# Patient Record
Sex: Male | Born: 1990 | Race: White | Hispanic: No | Marital: Single
Health system: Southern US, Community
[De-identification: ages and names within clinical notes are randomized; demographics above are authoritative.]

## PROBLEM LIST (undated history)

## (undated) DIAGNOSIS — N200 Calculus of kidney: Secondary | ICD-10-CM

## (undated) HISTORY — PX: WRIST FRACTURE SURGERY: SHX121

## (undated) HISTORY — PX: GASTROSTOMY: SHX151

## (undated) HISTORY — PX: HERNIA REPAIR: SHX51

---

## 2012-10-19 ENCOUNTER — Emergency Department (INDEPENDENT_AMBULATORY_CARE_PROVIDER_SITE_OTHER)
Admission: EM | Admit: 2012-10-19 | Discharge: 2012-10-19 | Disposition: A | Discharge status: Home / Self Care | Payer: BC Managed Care – PPO | Source: Home / Self Care | Attending: Family Medicine | Admitting: Family Medicine

## 2012-10-19 ENCOUNTER — Emergency Department (INDEPENDENT_AMBULATORY_CARE_PROVIDER_SITE_OTHER): Payer: BC Managed Care – PPO

## 2012-10-19 ENCOUNTER — Encounter: Payer: Self-pay | Admitting: Emergency Medicine

## 2012-10-19 DIAGNOSIS — IMO0002 Reserved for concepts with insufficient information to code with codable children: Secondary | ICD-10-CM

## 2012-10-19 DIAGNOSIS — S336XXA Sprain of sacroiliac joint, initial encounter: Secondary | ICD-10-CM

## 2012-10-19 DIAGNOSIS — M545 Low back pain, unspecified: Secondary | ICD-10-CM

## 2012-10-19 DIAGNOSIS — X500XXA Overexertion from strenuous movement or load, initial encounter: Secondary | ICD-10-CM

## 2012-10-19 DIAGNOSIS — S335XXA Sprain of ligaments of lumbar spine, initial encounter: Secondary | ICD-10-CM

## 2012-10-19 MED ORDER — MELOXICAM 15 MG PO TABS: 15.0000 mg | ORAL_TABLET | Freq: Every day | ORAL | Status: DC

## 2012-10-19 MED ORDER — PREDNISONE 20 MG PO TABS: 20.0000 mg | ORAL_TABLET | Freq: Two times a day (BID) | ORAL | Status: DC

## 2012-10-19 MED ORDER — CYCLOBENZAPRINE HCL 10 MG PO TABS: ORAL_TABLET | ORAL | Status: DC

## 2012-10-19 NOTE — ED Notes (Signed)
Low back injury, x 1 week, lifted heavy bucket has had pain ever since, yesterday pouring grout twisted pain is worse. Doesn't want to claim Worker's Comp.

## 2012-10-19 NOTE — ED Provider Notes (Signed)
History     CSN: [id number]  Arrival date & time 10/19/12  9562   First MD Initiated Contact with Patient 10/19/12 1051      Chief Complaint  Patient presents with  . Back Injury      HPI Comments: Patient states that about a week ago while lifting a heavy bucket, he felt a popping sensation in his lower back.  The pain gradually improved until yesterday when he was mixing grout and he felt recurrent pain in his right lower back.  The pain has persisted and is worse with movement.  The pain occasionally radiates to both thighs.  No bowel or bladder dysfunction.  No saddle numbness  Patient is a 22 y.o. male presenting with back pain. The history is provided by the patient.  Back Pain  This is a recurrent problem. The current episode started yesterday. The problem occurs constantly. The problem has not changed since onset.The pain is associated with lifting heavy objects. The pain is present in the lumbar spine. The quality of the pain is described as aching and stabbing. The pain radiates to the right thigh and left thigh. The pain is moderate. The symptoms are aggravated by certain positions. The pain is the same all the time. Pertinent negatives include no fever, no numbness, no weight loss, no abdominal pain, no bowel incontinence, no perianal numbness, no bladder incontinence, no dysuria, no leg pain, no paresthesias, no paresis, no tingling and no weakness. He has tried NSAIDs for the symptoms. The treatment provided no relief.    History reviewed. No pertinent past medical history.  Past Surgical History  Procedure Date  . Hernia repair     No family history on file.  History  Substance Use Topics  . Smoking status: Current Every Day Smoker -- 1.0 packs/day for 2 years    Types: Cigarettes  . Smokeless tobacco: Current User    Types: Chew  . Alcohol Use: No      Review of Systems  Constitutional: Negative for fever and weight loss.  Gastrointestinal: Negative for  abdominal pain and bowel incontinence.  Genitourinary: Negative for bladder incontinence and dysuria.  Musculoskeletal: Positive for back pain.  Neurological: Negative for tingling, weakness, numbness and paresthesias.  All other systems reviewed and are negative.    Allergies  Review of patient's allergies indicates no known allergies.  Home Medications   Current Outpatient Rx  Name  Route  Sig  Dispense  Refill  . NAPROXEN SODIUM 220 MG PO TABS   Oral   Take 220 mg by mouth 2 (two) times daily with a meal.         . CYCLOBENZAPRINE HCL 10 MG PO TABS      Take one tab by mouth at bedtime for muscle cramps   15 tablet   0   . MELOXICAM 15 MG PO TABS   Oral   Take 1 tablet (15 mg total) by mouth daily. Take with food each morning   10 tablet   0   . PREDNISONE 20 MG PO TABS   Oral   Take 1 tablet (20 mg total) by mouth 2 (two) times daily.   10 tablet   0     BP 118/76  Pulse 65  Temp 97.5 F (36.4 C) (Oral)  Resp 16  Ht 6\' 2"  (1.88 m)  Wt 233 lb (105.688 kg)  BMI 29.92 kg/m2  SpO2 99%  Physical Exam Nursing notes and Vital Signs reviewed. Appearance:  Patient appears healthy, stated age, and in no acute distress Eyes:  Pupils are equal, round, and reactive to light and accomodation.  Extraocular movement is intact.  Conjunctivae are not inflamed  Pharynx:  Normal Neck:  Supple.   No adenopathy Lungs:  Clear to auscultation.  Breath sounds are equal.  Heart:  Regular rate and rhythm without murmurs, rubs, or gallops.  Abdomen:  Nontender without masses or hepatosplenomegaly.  Bowel sounds are present.  No CVA or flank tenderness.  Extremities:  No edema.  No calf tenderness Skin:  No rash present. Back:   Can heel/toe walk and squat without difficulty.  Decreased forward flexion.  Tenderness in the midline and bilateral paraspinous muscles at L4-L5.  Straight leg raising test is negative.  Sitting knee extension test is negative.  Strength and sensation in  the lower extremities is normal.  Patellar and achilles reflexes are normal    ED Course  Procedures none    Dg Lumbar Spine Complete  10/19/2012  *RADIOLOGY REPORT*  Clinical Data: Low back pain.  Injury 1 week ago.  LUMBAR SPINE - COMPLETE 4+ VIEW  Comparison: None.  Findings: Five lumbar-type vertebral bodies show normal alignment. Disc space heights are normal.  No evidence of facet arthropathy, pars defect or slippage.  Sacroiliac joints appear normal.  IMPRESSION: Negative radiographs   Original Report Authenticated By: Paulina Fusi, M.D.      1. Low back sprain       MDM  Begin prednisone burst.  After that, begin Mobic.  Flexeril at bedtime. Apply ice pack to lower back twice daily for about 30 minutes.   Begin Mobic (meloxicam) AFTER finishing prednisone. Begin back exercises in about 5 to 7 days. May use back brace at work for about 2 weeks, then discontinue. Followup with [facility] if not improving about three weeks.        Lattie Haw, MD 10/23/12 [phone]

## 2015-10-17 ENCOUNTER — Emergency Department (HOSPITAL_COMMUNITY): Payer: BLUE CROSS/BLUE SHIELD

## 2015-10-17 ENCOUNTER — Observation Stay (HOSPITAL_COMMUNITY)
Admission: EM | Admit: 2015-10-17 | Discharge: 2015-10-18 | Disposition: A | Discharge status: Home / Self Care | Payer: BLUE CROSS/BLUE SHIELD | Attending: General Surgery | Admitting: General Surgery

## 2015-10-17 ENCOUNTER — Encounter (HOSPITAL_COMMUNITY): Payer: Self-pay | Admitting: Emergency Medicine

## 2015-10-17 DIAGNOSIS — R1031 Right lower quadrant pain: Secondary | ICD-10-CM | POA: Diagnosis present

## 2015-10-17 DIAGNOSIS — K3580 Unspecified acute appendicitis: Secondary | ICD-10-CM | POA: Diagnosis not present

## 2015-10-17 DIAGNOSIS — R112 Nausea with vomiting, unspecified: Secondary | ICD-10-CM | POA: Insufficient documentation

## 2015-10-17 DIAGNOSIS — F17210 Nicotine dependence, cigarettes, uncomplicated: Secondary | ICD-10-CM | POA: Diagnosis not present

## 2015-10-17 DIAGNOSIS — F17220 Nicotine dependence, chewing tobacco, uncomplicated: Secondary | ICD-10-CM | POA: Diagnosis not present

## 2015-10-17 LAB — COMPREHENSIVE METABOLIC PANEL
ALT: 23 U/L (ref 17–63)
AST: 24 U/L (ref 15–41)
Albumin: 5.3 g/dL — ABNORMAL HIGH (ref 3.5–5.0)
Alkaline Phosphatase: 58 U/L (ref 38–126)
Anion gap: 12 (ref 5–15)
BUN: 11 mg/dL (ref 6–20)
CO2: 25 mmol/L (ref 22–32)
Calcium: 9.8 mg/dL (ref 8.9–10.3)
Chloride: 100 mmol/L — ABNORMAL LOW (ref 101–111)
Creatinine, Ser: 0.94 mg/dL (ref 0.61–1.24)
GFR calc Af Amer: >60 mL/min (ref >60)
GFR calc non Af Amer: >60 mL/min (ref >60)
Glucose, Bld: 126 mg/dL — ABNORMAL HIGH (ref 65–99)
Potassium: 3.9 mmol/L (ref 3.5–5.1)
Sodium: 137 mmol/L (ref 135–145)
Total Bilirubin: 2 mg/dL — ABNORMAL HIGH (ref 0.3–1.2)
Total Protein: 8.2 g/dL — ABNORMAL HIGH (ref 6.5–8.1)

## 2015-10-17 LAB — CBC
HCT: 46.1 % (ref 39.0–52.0)
Hemoglobin: 16.9 g/dL (ref 13.0–17.0)
MCH: 30.5 pg (ref 26.0–34.0)
MCHC: 36.7 g/dL — ABNORMAL HIGH (ref 30.0–36.0)
MCV: 83.2 fL (ref 78.0–100.0)
Platelets: 237 10*3/uL (ref 150–400)
RBC: 5.54 MIL/uL (ref 4.22–5.81)
RDW: 11.9 % (ref 11.5–15.5)
WBC: 15.8 10*3/uL — ABNORMAL HIGH (ref 4.0–10.5)

## 2015-10-17 LAB — LIPASE, BLOOD: Lipase: 21 U/L (ref 11–51)

## 2015-10-17 MED ORDER — ONDANSETRON HCL 4 MG/2ML IJ SOLN
4.0000 mg | Freq: Once | INTRAMUSCULAR | Status: AC
  Administered 2015-10-17: 4 mg via INTRAVENOUS
  Filled 2015-10-17: qty 2

## 2015-10-17 MED ORDER — IOHEXOL 300 MG/ML  SOLN
100.0000 mL | Freq: Once | INTRAMUSCULAR | Status: AC | PRN
  Administered 2015-10-17: 100 mL via INTRAVENOUS

## 2015-10-17 MED ORDER — HYDROMORPHONE HCL 1 MG/ML IJ SOLN
1.0000 mg | Freq: Once | INTRAMUSCULAR | Status: AC
  Administered 2015-10-17: 1 mg via INTRAVENOUS
  Filled 2015-10-17: qty 1

## 2015-10-17 MED ORDER — HYDROMORPHONE HCL 1 MG/ML IJ SOLN
1.0000 mg | Freq: Once | INTRAMUSCULAR | Status: AC
Start: 2015-10-17 — End: 2015-10-17
  Administered 2015-10-17: 1 mg via INTRAVENOUS
  Filled 2015-10-17: qty 1

## 2015-10-17 MED ORDER — PIPERACILLIN-TAZOBACTAM 3.375 G IVPB 30 MIN
3.3750 g | Freq: Once | INTRAVENOUS | Status: AC
  Administered 2015-10-17: 3.375 g via INTRAVENOUS
  Filled 2015-10-17: qty 50

## 2015-10-17 NOTE — ED Notes (Signed)
Patient c/o RLQ pain- sudden onset with nausea and vomiting X1.

## 2015-10-17 NOTE — ED Provider Notes (Signed)
Patient seen/examined in the Emergency Department in conjunction with Midlevel Provider [facility] Patient reports RLQ abd pain Exam : awake/alert,, clutching his right lower abdomen Plan: pt with acute appendicitis.  Surgery has been consulted.   Zadie Rhine, MD 10/17/15 [phone]

## 2015-10-17 NOTE — ED Provider Notes (Signed)
CSN: [id number]     Arrival date & time 10/17/15  2121 History   First MD Initiated Contact with Patient 10/17/15 2144     Chief Complaint  Patient presents with  . Abdominal Pain     (Consider location/radiation/quality/duration/timing/severity/associated sxs/prior Treatment) Patient is a 25 y.o. male presenting with abdominal pain. The history is provided by the patient.  Abdominal Pain Pain location:  RLQ Pain quality: squeezing   Pain radiates to:  Does not radiate Pain severity:  Severe Onset quality:  Gradual Duration:  1 day Timing:  Constant Progression:  Worsening Chronicity:  New Relieved by:  Nothing Worsened by:  Nothing tried Ineffective treatments:  Acetaminophen, NSAIDs, lying down and antacids Associated symptoms: anorexia, chills and nausea   Associated symptoms: no dysuria    Olegario Emberson is a 25 y.o. male who presents to the ED with abdominal pain that started last night. He reports that the pain started as generalized abdominal pain and he took Pepcid and Gas X he also took OTC pain medication without relief. He went to work and the pain increased as the night went on. Today the pain has increased and has localized in the RLQ with nausea. He reports that the pain is so bad now it hurts to move or walk. On the way here every bump caused pain.   History reviewed. No pertinent past medical history. Past Surgical History  Procedure Laterality Date  . Hernia repair     No family history on file. Social History  Substance Use Topics  . Smoking status: Current Every Day Smoker -- 1.00 packs/day for 2 years    Types: Cigarettes  . Smokeless tobacco: Current User    Types: Chew  . Alcohol Use: No    Review of Systems  Constitutional: Positive for chills.  Gastrointestinal: Positive for nausea, abdominal pain and anorexia.  Genitourinary: Negative for dysuria, frequency, discharge, scrotal swelling, penile pain and testicular pain.  all other systems  negative    Allergies  Review of patient's allergies indicates no known allergies.  Home Medications   Prior to Admission medications   Medication Sig Start Date End Date Taking? Authorizing Provider  Alpha-D-Galactosidase (BEANO PO) Take 1 tablet by mouth daily as needed (for acid relief).   Yes Historical Provider, MD  famotidine (PEPCID) 20 MG tablet Take 20 mg by mouth daily as needed for heartburn or indigestion.   Yes Historical Provider, MD  naproxen sodium (ANAPROX) 220 MG tablet Take 220 mg by mouth 2 (two) times daily with a meal.   Yes Historical Provider, MD  simethicone (GAS-X) 80 MG chewable tablet Chew 80 mg by mouth every 6 (six) hours as needed for flatulence.   Yes Historical Provider, MD   BP 142/83 mmHg  Pulse 110  Temp(Src) 97.9 F (36.6 C) (Oral)  Resp 18  Ht  (1.88 m)  Wt 97.523 kg  BMI 27.59 kg/m2  SpO2 100% Physical Exam  Constitutional: He is oriented to person, place, and time. He appears well-developed and well-nourished. No distress.  HENT:  Head: Normocephalic and atraumatic.  Eyes: Conjunctivae and EOM are normal.  Neck: Normal range of motion. Neck supple.  Cardiovascular: Normal rate and regular rhythm.   Pulmonary/Chest: Effort normal and breath sounds normal.  Abdominal: Soft. Bowel sounds are normal. There is tenderness in the right lower quadrant. There is rebound, guarding and tenderness at McBurney's point. There is no CVA tenderness.  Musculoskeletal: Normal range of motion.  Neurological: He is alert  and oriented to person, place, and time. No cranial nerve deficit.  Skin: Skin is warm and dry.  Psychiatric: He has a normal mood and affect. His behavior is normal.  Nursing note and vitals reviewed.   ED Course  Procedures (including critical care time) Labs Review Results for orders placed or performed during the hospital encounter of 10/17/15 (from the past 24 hour(s))  Lipase, blood     Status: None   Collection Time:  10/17/15  9:52 PM  Result Value Ref Range   Lipase 21 11 - 51 U/L  Comprehensive metabolic panel     Status: Abnormal   Collection Time: 10/17/15  9:52 PM  Result Value Ref Range   Sodium 137 135 - 145 mmol/L   Potassium 3.9 3.5 - 5.1 mmol/L   Chloride 100 (L) 101 - 111 mmol/L   CO2 25 22 - 32 mmol/L   Glucose, Bld 126 (H) 65 - 99 mg/dL   BUN 11 6 - 20 mg/dL   Creatinine, Ser 2.13 0.61 - 1.24 mg/dL   Calcium 9.8 8.9 - 08.6 mg/dL   Total Protein 8.2 (H) 6.5 - 8.1 g/dL   Albumin 5.3 (H) 3.5 - 5.0 g/dL   AST 24 15 - 41 U/L   ALT 23 17 - 63 U/L   Alkaline Phosphatase 58 38 - 126 U/L   Total Bilirubin 2.0 (H) 0.3 - 1.2 mg/dL   GFR calc non Af Amer >60 >60 mL/min   GFR calc Af Amer >60 >60 mL/min   Anion gap 12 5 - 15  CBC     Status: Abnormal   Collection Time: 10/17/15  9:52 PM  Result Value Ref Range   WBC 15.8 (H) 4.0 - 10.5 K/uL   RBC 5.54 4.22 - 5.81 MIL/uL   Hemoglobin 16.9 13.0 - 17.0 g/dL   HCT 57.8 46.9 - 62.9 %   MCV 83.2 78.0 - 100.0 fL   MCH 30.5 26.0 - 34.0 pg   MCHC 36.7 (H) 30.0 - 36.0 g/dL   RDW 52.8 41.3 - 24.4 %   Platelets 237 150 - 400 K/uL     Imaging Review Ct Abdomen Pelvis W Contrast  10/17/2015  CLINICAL DATA:  Right lower quadrant pain since yesterday. Initial encounter. EXAM: CT ABDOMEN AND PELVIS WITH CONTRAST TECHNIQUE: Multidetector CT imaging of the abdomen and pelvis was performed using the standard protocol following bolus administration of intravenous contrast. CONTRAST:  100 mL OMNIPAQUE IOHEXOL 300 MG/ML  SOLN COMPARISON:  None. FINDINGS: Lung bases are clear.  No pleural or pericardial effusion. The appendix is dilated with periappendiceal stranding consistent with acute appendicitis. No abscess or perforation is identified. The stomach and small and large bowel appear normal. The gallbladder, liver, spleen, adrenal glands, kidneys and pancreas appear normal. There is no lymphadenopathy or fluid. No focal bony abnormality is seen. IMPRESSION:  Acute appendicitis without abscess or perforation. These results were called by telephone at the time of interpretation on 10/17/2015 at 11:17 pm to HOPE NEESE, N.P. , who verbally acknowledged these results. Electronically Signed   By: Drusilla Kanner M.D.   On: 10/17/2015 23:19   I have personally reviewed and evaluated thelab results as part of my medical decision-making.   MDM  25 y.o. male with abdominal pain that started 24 hours prior to arrival to the ED and has gotten progressively worse. Will admit to Dr. Lovell Sheehan and start antibiotics. Dr. Lovell Sheehan will plan to do surgery. I discussed lab, CT results with  the patient and plans for admission and surgery. He voices understanding and agrees with plan.   Final diagnoses:  Acute appendicitis, unspecified acute appendicitis type      [facility], NP 10/17/15 1610  Zadie Rhine, MD 10/18/15 [phone]

## 2015-10-17 NOTE — ED Notes (Signed)
Pt c/o rt lower abd pain since last night.

## 2015-10-18 ENCOUNTER — Observation Stay (HOSPITAL_COMMUNITY): Payer: BLUE CROSS/BLUE SHIELD | Admitting: Anesthesiology

## 2015-10-18 ENCOUNTER — Encounter (HOSPITAL_COMMUNITY): Payer: Self-pay | Admitting: *Deleted

## 2015-10-18 ENCOUNTER — Encounter (HOSPITAL_COMMUNITY)
Admission: EM | Disposition: A | Discharge status: Home / Self Care | Payer: Self-pay | Source: Home / Self Care | Attending: Emergency Medicine

## 2015-10-18 DIAGNOSIS — K3580 Unspecified acute appendicitis: Secondary | ICD-10-CM | POA: Diagnosis not present

## 2015-10-18 HISTORY — PX: LAPAROSCOPIC APPENDECTOMY: SHX408

## 2015-10-18 LAB — SURGICAL PCR SCREEN
MRSA, PCR: NEGATIVE
Staphylococcus aureus: NEGATIVE

## 2015-10-18 LAB — URINALYSIS, ROUTINE W REFLEX MICROSCOPIC
Bilirubin Urine: NEGATIVE
Glucose, UA: NEGATIVE mg/dL
Hgb urine dipstick: NEGATIVE
Ketones, ur: NEGATIVE mg/dL
Leukocytes, UA: NEGATIVE
Nitrite: NEGATIVE
Protein, ur: NEGATIVE mg/dL
Specific Gravity, Urine: <1.005 — ABNORMAL LOW (ref 1.005–1.030)
pH: 6.5 (ref 5.0–8.0)

## 2015-10-18 SURGERY — APPENDECTOMY, LAPAROSCOPIC
Anesthesia: General

## 2015-10-18 MED ORDER — ENOXAPARIN SODIUM 40 MG/0.4ML ~~LOC~~ SOLN
40.0000 mg | SUBCUTANEOUS | Status: DC
  Administered 2015-10-18: 40 mg via SUBCUTANEOUS
  Filled 2015-10-18: qty 0.4

## 2015-10-18 MED ORDER — ACETAMINOPHEN 650 MG RE SUPP: 650.0000 mg | Freq: Four times a day (QID) | RECTAL | Status: DC | PRN

## 2015-10-18 MED ORDER — LACTATED RINGERS IV SOLN
INTRAVENOUS | Status: DC
  Administered 2015-10-18 (×2): via INTRAVENOUS

## 2015-10-18 MED ORDER — DIPHENHYDRAMINE HCL 50 MG/ML IJ SOLN: 25.0000 mg | Freq: Four times a day (QID) | INTRAMUSCULAR | Status: DC | PRN

## 2015-10-18 MED ORDER — ENOXAPARIN SODIUM 40 MG/0.4ML ~~LOC~~ SOLN: 40.0000 mg | SUBCUTANEOUS | Status: DC

## 2015-10-18 MED ORDER — HYDROMORPHONE HCL 1 MG/ML IJ SOLN
1.0000 mg | INTRAMUSCULAR | Status: DC | PRN
  Administered 2015-10-18: 1 mg via INTRAVENOUS
  Filled 2015-10-18: qty 1

## 2015-10-18 MED ORDER — NEOSTIGMINE METHYLSULFATE 10 MG/10ML IV SOLN
INTRAVENOUS | Status: DC | PRN
  Administered 2015-10-18: 3 mg via INTRAVENOUS

## 2015-10-18 MED ORDER — GLYCOPYRROLATE 0.2 MG/ML IJ SOLN
INTRAMUSCULAR | Status: DC | PRN
  Administered 2015-10-18: 0.6 mg via INTRAVENOUS

## 2015-10-18 MED ORDER — NEOSTIGMINE METHYLSULFATE 10 MG/10ML IV SOLN
INTRAVENOUS | Status: AC
  Filled 2015-10-18: qty 1

## 2015-10-18 MED ORDER — LACTATED RINGERS IV SOLN: INTRAVENOUS | Status: DC

## 2015-10-18 MED ORDER — ONDANSETRON HCL 4 MG/2ML IJ SOLN
4.0000 mg | Freq: Four times a day (QID) | INTRAMUSCULAR | Status: DC | PRN
Start: 2015-10-18 — End: 2015-10-18

## 2015-10-18 MED ORDER — LORAZEPAM 2 MG/ML IJ SOLN: 1.0000 mg | INTRAMUSCULAR | Status: DC | PRN

## 2015-10-18 MED ORDER — OXYCODONE-ACETAMINOPHEN 7.5-325 MG PO TABS: 1.0000 | ORAL_TABLET | ORAL | Status: DC | PRN

## 2015-10-18 MED ORDER — FENTANYL CITRATE (PF) 100 MCG/2ML IJ SOLN
INTRAMUSCULAR | Status: DC | PRN
  Administered 2015-10-18 (×2): 50 ug via INTRAVENOUS
  Administered 2015-10-18: 100 ug via INTRAVENOUS
  Administered 2015-10-18: 50 ug via INTRAVENOUS

## 2015-10-18 MED ORDER — FENTANYL CITRATE (PF) 100 MCG/2ML IJ SOLN
25.0000 ug | INTRAMUSCULAR | Status: AC
  Administered 2015-10-18 (×2): 25 ug via INTRAVENOUS

## 2015-10-18 MED ORDER — DIPHENHYDRAMINE HCL 25 MG PO CAPS: 25.0000 mg | ORAL_CAPSULE | Freq: Four times a day (QID) | ORAL | Status: DC | PRN

## 2015-10-18 MED ORDER — OXYCODONE-ACETAMINOPHEN 5-325 MG PO TABS: 1.0000 | ORAL_TABLET | ORAL | Status: DC | PRN

## 2015-10-18 MED ORDER — PIPERACILLIN-TAZOBACTAM 3.375 G IVPB
3.3750 g | Freq: Three times a day (TID) | INTRAVENOUS | Status: DC
  Administered 2015-10-18: 3.375 g via INTRAVENOUS

## 2015-10-18 MED ORDER — PROPOFOL 10 MG/ML IV BOLUS
INTRAVENOUS | Status: AC
  Filled 2015-10-18: qty 40

## 2015-10-18 MED ORDER — FENTANYL CITRATE (PF) 250 MCG/5ML IJ SOLN
INTRAMUSCULAR | Status: AC
  Filled 2015-10-18: qty 5

## 2015-10-18 MED ORDER — HYDROMORPHONE HCL 1 MG/ML IJ SOLN
2.0000 mg | INTRAMUSCULAR | Status: DC | PRN
Start: 2015-10-18 — End: 2015-10-18
  Administered 2015-10-18 (×5): 2 mg via INTRAVENOUS
  Filled 2015-10-18 (×3): qty 2

## 2015-10-18 MED ORDER — BUPIVACAINE HCL (PF) 0.5 % IJ SOLN
INTRAMUSCULAR | Status: DC | PRN
  Administered 2015-10-18: 10 mL

## 2015-10-18 MED ORDER — SODIUM CHLORIDE 0.9 % IR SOLN
Status: DC | PRN
  Administered 2015-10-18: 1000 mL

## 2015-10-18 MED ORDER — KETOROLAC TROMETHAMINE 30 MG/ML IJ SOLN
30.0000 mg | Freq: Once | INTRAMUSCULAR | Status: AC
  Administered 2015-10-18: 30 mg via INTRAVENOUS

## 2015-10-18 MED ORDER — BUPIVACAINE HCL (PF) 0.5 % IJ SOLN
INTRAMUSCULAR | Status: AC
  Filled 2015-10-18: qty 30

## 2015-10-18 MED ORDER — ONDANSETRON HCL 4 MG/2ML IJ SOLN: 4.0000 mg | Freq: Once | INTRAMUSCULAR | Status: DC | PRN

## 2015-10-18 MED ORDER — ROCURONIUM BROMIDE 50 MG/5ML IV SOLN
INTRAVENOUS | Status: AC
  Filled 2015-10-18: qty 1

## 2015-10-18 MED ORDER — KETOROLAC TROMETHAMINE 30 MG/ML IJ SOLN
INTRAMUSCULAR | Status: AC
  Filled 2015-10-18: qty 1

## 2015-10-18 MED ORDER — ONDANSETRON HCL 4 MG/2ML IJ SOLN
4.0000 mg | Freq: Four times a day (QID) | INTRAMUSCULAR | Status: DC | PRN
  Administered 2015-10-18: 4 mg via INTRAVENOUS
  Filled 2015-10-18: qty 2

## 2015-10-18 MED ORDER — FENTANYL CITRATE (PF) 100 MCG/2ML IJ SOLN
INTRAMUSCULAR | Status: AC
  Filled 2015-10-18: qty 2

## 2015-10-18 MED ORDER — FENTANYL CITRATE (PF) 100 MCG/2ML IJ SOLN
25.0000 ug | INTRAMUSCULAR | Status: DC | PRN
  Administered 2015-10-18: 50 ug via INTRAVENOUS
  Filled 2015-10-18: qty 2

## 2015-10-18 MED ORDER — SIMETHICONE 80 MG PO CHEW: 40.0000 mg | CHEWABLE_TABLET | Freq: Four times a day (QID) | ORAL | Status: DC | PRN

## 2015-10-18 MED ORDER — SUCCINYLCHOLINE CHLORIDE 20 MG/ML IJ SOLN
INTRAMUSCULAR | Status: AC
  Filled 2015-10-18: qty 1

## 2015-10-18 MED ORDER — PROPOFOL 10 MG/ML IV BOLUS
INTRAVENOUS | Status: DC | PRN
  Administered 2015-10-18: 20 mg via INTRAVENOUS
  Administered 2015-10-18: 30 mg via INTRAVENOUS
  Administered 2015-10-18: 180 mg via INTRAVENOUS

## 2015-10-18 MED ORDER — SUCCINYLCHOLINE CHLORIDE 20 MG/ML IJ SOLN
INTRAMUSCULAR | Status: DC | PRN
  Administered 2015-10-18: 120 mg via INTRAVENOUS

## 2015-10-18 MED ORDER — POVIDONE-IODINE 10 % OINT PACKET
TOPICAL_OINTMENT | CUTANEOUS | Status: DC | PRN
  Administered 2015-10-18: 1 via TOPICAL

## 2015-10-18 MED ORDER — MIDAZOLAM HCL 2 MG/2ML IJ SOLN
1.0000 mg | INTRAMUSCULAR | Status: DC | PRN
  Administered 2015-10-18: 2 mg via INTRAVENOUS
  Filled 2015-10-18: qty 2

## 2015-10-18 MED ORDER — ACETAMINOPHEN 325 MG PO TABS: 650.0000 mg | ORAL_TABLET | Freq: Four times a day (QID) | ORAL | Status: DC | PRN

## 2015-10-18 MED ORDER — FENTANYL CITRATE (PF) 100 MCG/2ML IJ SOLN
25.0000 ug | Freq: Once | INTRAMUSCULAR | Status: AC
  Administered 2015-10-18: 25 ug via INTRAVENOUS

## 2015-10-18 MED ORDER — LIDOCAINE HCL (PF) 1 % IJ SOLN
INTRAMUSCULAR | Status: AC
  Filled 2015-10-18: qty 5

## 2015-10-18 MED ORDER — ONDANSETRON 4 MG PO TBDP: 4.0000 mg | ORAL_TABLET | Freq: Four times a day (QID) | ORAL | Status: DC | PRN

## 2015-10-18 MED ORDER — POVIDONE-IODINE 10 % EX OINT
TOPICAL_OINTMENT | CUTANEOUS | Status: AC
  Filled 2015-10-18: qty 1

## 2015-10-18 MED ORDER — LIDOCAINE HCL (CARDIAC) 10 MG/ML IV SOLN
INTRAVENOUS | Status: DC | PRN
  Administered 2015-10-18: 50 mg via INTRAVENOUS

## 2015-10-18 MED ORDER — HYDROMORPHONE HCL 1 MG/ML IJ SOLN
2.0000 mg | INTRAMUSCULAR | Status: DC | PRN
Start: 2015-10-18 — End: 2015-10-18
  Filled 2015-10-18 (×2): qty 2

## 2015-10-18 MED ORDER — ONDANSETRON HCL 4 MG/2ML IJ SOLN
4.0000 mg | Freq: Once | INTRAMUSCULAR | Status: AC
  Administered 2015-10-18: 4 mg via INTRAVENOUS
  Filled 2015-10-18: qty 2

## 2015-10-18 MED ORDER — GLYCOPYRROLATE 0.2 MG/ML IJ SOLN
INTRAMUSCULAR | Status: AC
  Filled 2015-10-18: qty 3

## 2015-10-18 MED ORDER — ROCURONIUM BROMIDE 100 MG/10ML IV SOLN
INTRAVENOUS | Status: DC | PRN
  Administered 2015-10-18: 30 mg via INTRAVENOUS
  Administered 2015-10-18: 5 mg via INTRAVENOUS

## 2015-10-18 SURGICAL SUPPLY — 39 items
BAG HAMPER (MISCELLANEOUS) ×2 IMPLANT
CHLORAPREP W/TINT 26ML (MISCELLANEOUS) ×2 IMPLANT
CLOTH BEACON ORANGE TIMEOUT ST (SAFETY) ×2 IMPLANT
COVER LIGHT HANDLE STERIS (MISCELLANEOUS) ×4 IMPLANT
CUTTER FLEX LINEAR 45M (STAPLE) ×2 IMPLANT
DECANTER SPIKE VIAL GLASS SM (MISCELLANEOUS) ×2 IMPLANT
ELECT REM PT RETURN 9FT ADLT (ELECTROSURGICAL) ×2
ELECTRODE REM PT RTRN 9FT ADLT (ELECTROSURGICAL) ×1 IMPLANT
EVACUATOR SMOKE 8.L (FILTER) ×2 IMPLANT
FORMALIN 10 PREFIL 120ML (MISCELLANEOUS) ×2 IMPLANT
GLOVE BIOGEL PI IND STRL 7.0 (GLOVE) ×1 IMPLANT
GLOVE BIOGEL PI INDICATOR 7.0 (GLOVE) ×1
GLOVE SURG SS PI 7.5 STRL IVOR (GLOVE) ×4 IMPLANT
GOWN STRL REUS W/ TWL XL LVL3 (GOWN DISPOSABLE) ×1 IMPLANT
GOWN STRL REUS W/TWL LRG LVL3 (GOWN DISPOSABLE) ×2 IMPLANT
GOWN STRL REUS W/TWL XL LVL3 (GOWN DISPOSABLE) ×1
INST SET LAPROSCOPIC AP (KITS) ×2 IMPLANT
KIT ROOM TURNOVER APOR (KITS) ×2 IMPLANT
MANIFOLD NEPTUNE II (INSTRUMENTS) ×2 IMPLANT
NEEDLE INSUFFLATION 14GA 120MM (NEEDLE) ×2 IMPLANT
NS IRRIG 1000ML POUR BTL (IV SOLUTION) ×2 IMPLANT
PACK LAP CHOLE LZT030E (CUSTOM PROCEDURE TRAY) ×2 IMPLANT
PAD ARMBOARD 7.5X6 YLW CONV (MISCELLANEOUS) ×2 IMPLANT
PENCIL HANDSWITCHING (ELECTRODE) ×2 IMPLANT
POUCH SPECIMEN RETRIEVAL 10MM (ENDOMECHANICALS) ×2 IMPLANT
RELOAD 45 VASCULAR/THIN (ENDOMECHANICALS) ×2 IMPLANT
SET BASIN LINEN APH (SET/KITS/TRAYS/PACK) ×2 IMPLANT
SHEARS HARMONIC ACE PLUS 36CM (ENDOMECHANICALS) ×2 IMPLANT
SPONGE GAUZE 2X2 8PLY STRL LF (GAUZE/BANDAGES/DRESSINGS) ×6 IMPLANT
STAPLER VISISTAT (STAPLE) ×2 IMPLANT
SUT VICRYL 0 UR6 27IN ABS (SUTURE) ×2 IMPLANT
TAPE CLOTH SURG 4X10 WHT LF (GAUZE/BANDAGES/DRESSINGS) ×2 IMPLANT
TRAY FOLEY CATH SILVER 16FR (SET/KITS/TRAYS/PACK) ×2 IMPLANT
TROCAR ENDO BLADELESS 11MM (ENDOMECHANICALS) ×2 IMPLANT
TROCAR ENDO BLADELESS 12MM (ENDOMECHANICALS) ×2 IMPLANT
TROCAR XCEL NON-BLD 5MMX100MML (ENDOMECHANICALS) ×2 IMPLANT
TUBING INSUFFLATION (TUBING) ×2 IMPLANT
WARMER LAPAROSCOPE (MISCELLANEOUS) ×2 IMPLANT
YANKAUER SUCT 12FT TUBE ARGYLE (SUCTIONS) ×2 IMPLANT

## 2015-10-18 NOTE — Anesthesia Preprocedure Evaluation (Signed)
Anesthesia Evaluation  Patient identified by MRN, date of birth, ID band Patient awake    Reviewed: Allergy & Precautions, NPO status , Patient's Chart, lab work & pertinent test results  Airway Mallampati: I  TM Distance: >3 FB     Dental  (+) Teeth Intact, Dental Advisory Given   Pulmonary Current Smoker,    breath sounds clear to auscultation       Cardiovascular negative cardio ROS   Rhythm:Regular Rate:Normal     Neuro/Psych    GI/Hepatic GERD  ,RLQ pain    Endo/Other    Renal/GU      Musculoskeletal   Abdominal   Peds  Hematology   Anesthesia Other Findings   Reproductive/Obstetrics                             Anesthesia Physical Anesthesia Plan  ASA: II and emergent  Anesthesia Plan: General   Post-op Pain Management:    Induction: Intravenous, Rapid sequence and Cricoid pressure planned  Airway Management Planned: Oral ETT  Additional Equipment:   Intra-op Plan:   Post-operative Plan: Extubation in OR  Informed Consent: I have reviewed the patients History and Physical, chart, labs and discussed the procedure including the risks, benefits and alternatives for the proposed anesthesia with the patient or authorized representative who has indicated his/her understanding and acceptance.     Plan Discussed with:   Anesthesia Plan Comments:         Anesthesia Quick Evaluation

## 2015-10-18 NOTE — Anesthesia Postprocedure Evaluation (Signed)
Anesthesia Post Note  Patient: Jason Peters  Procedure(s) Performed: Procedure(s) (LRB): APPENDECTOMY LAPAROSCOPIC (N/A)  Patient location during evaluation: PACU Anesthesia Type: General Level of consciousness: awake, oriented and patient cooperative Pain management: satisfactory to patient Vital Signs Assessment: post-procedure vital signs reviewed and stable Respiratory status: spontaneous breathing and non-rebreather facemask Cardiovascular status: stable Anesthetic complications: no    Last Vitals:  Filed Vitals:   10/18/15 1035 10/18/15 1153  BP: 107/56   Pulse:    Temp:  38.8 C  Resp: 14     Last Pain:  Filed Vitals:   10/18/15 1202  PainSc: Asleep                 Taylan Mayhan

## 2015-10-18 NOTE — Discharge Summary (Signed)
Physician Discharge Summary  Patient ID: Jason Peters MRN: [id number] DOB/AGE: 15-Jan-1991 24 y.o.  Admit date: 10/17/2015 Discharge date: 10/18/2015  Admission Diagnoses: Acute appendicitis  Discharge Diagnoses: Same Active Problems:   Acute appendicitis   Discharged Condition: good  Hospital Course: Patient is a 25 year old white male who presented emergency room with a 24-hour history of worsening right lower quadrant abdominal pain. CT scan of the abdomen and pelvis revealed acute appendicitis. He was taken to the operating room on 10/18/2015 and underwent laparoscopic appendectomy. Tolerated the procedure well. His postoperative course has been unremarkable. His diet was advanced without difficulty. The patient is being discharged home on 10/18/2015 in good and improving condition.  Treatments: surgery: Laparoscopic appendectomy on 10/18/2015  Discharge Exam: Blood pressure 126/72, pulse 87, temperature 99.2 F (37.3 C), temperature source Oral, resp. rate 14, height  (1.88 m), weight 97.51 kg (214 lb 15.5 oz), SpO2 97 %. General appearance: alert, cooperative and no distress Resp: clear to auscultation bilaterally Cardio: regular rate and rhythm, S1, S2 normal, no murmur, click, rub or gallop GI: Soft, dressings dry and intact.  Disposition: 01-Home or Self Care     Medication List    TAKE these medications        BEANO PO  Take 1 tablet by mouth daily as needed (for acid relief).     famotidine 20 MG tablet  Commonly known as:  PEPCID  Take 20 mg by mouth daily as needed for heartburn or indigestion.     GAS-X 80 MG chewable tablet  Generic drug:  simethicone  Chew 80 mg by mouth every 6 (six) hours as needed for flatulence.     naproxen sodium 220 MG tablet  Commonly known as:  ANAPROX  Take 220 mg by mouth 2 (two) times daily with a meal.     oxyCODONE-acetaminophen 7.5-325 MG tablet  Commonly known as:  PERCOCET  Take 1-2 tablets by mouth every 4  (four) hours as needed.           Follow-up Information    Follow up with Dalia Heading, MD. Schedule an appointment as soon as possible for a visit on 10/24/2015.   Specialty:  General Surgery   Contact information:   [address] [place] [address] [phone]       Signed: Franky Macho A 10/18/2015, 3:44 PM

## 2015-10-18 NOTE — Transfer of Care (Signed)
Immediate Anesthesia Transfer of Care Note  Patient: Jason Peters  Procedure(s) Performed: Procedure(s): APPENDECTOMY LAPAROSCOPIC (N/A)  Patient Location: PACU  Anesthesia Type:General  Level of Consciousness: sedated and patient cooperative  Airway & Oxygen Therapy: Patient Spontanous Breathing and non-rebreather face mask  Post-op Assessment: Report given to RN, Post -op Vital signs reviewed and stable and Patient moving all extremities  Post vital signs: stable  Last Vitals:  Filed Vitals:   10/18/15 1030 10/18/15 1035  BP: 107/56 107/56  Pulse:    Temp:    Resp: 12 14    Complications: No apparent anesthesia complications

## 2015-10-18 NOTE — Discharge Instructions (Signed)
Laparoscopic Appendectomy, Adult, Care After °Refer to this sheet in the next few weeks. These instructions provide you with information on caring for yourself after your procedure. Your caregiver may also give you more specific instructions. Your treatment has been planned according to current medical practices, but problems sometimes occur. Call your caregiver if you have any problems or questions after your procedure. °HOME CARE INSTRUCTIONS °· Do not drive while taking narcotic pain medicines. °· Use stool softener if you become constipated from your pain medicines. °· Change your bandages (dressings) as directed. °· Keep your wounds clean and dry. You may wash the wounds gently with soap and water. Gently pat the wounds dry with a clean towel. °· Do not take baths, swim, or use hot tubs for 10 days, or as instructed by your caregiver. °· Only take over-the-counter or prescription medicines for pain, discomfort, or fever as directed by your caregiver. °· You may continue your normal diet as directed. °· Do not lift more than 10 pounds (4.5 kg) or play contact sports for 3 weeks, or as directed. °· Slowly increase your activity after surgery. °· Take deep breaths to avoid getting a lung infection (pneumonia). °SEEK MEDICAL CARE IF: °· You have redness, swelling, or increasing pain in your wounds. °· You have pus coming from your wounds. °· You have drainage from a wound that lasts longer than 1 day. °· You notice a bad smell coming from the wounds or dressing. °· Your wound edges break open after stitches (sutures) have been removed. °· You notice increasing pain in the shoulders (shoulder strap areas) or near your shoulder blades. °· You develop dizzy episodes or fainting while standing. °· You develop shortness of breath. °· You develop persistent nausea or vomiting. °· You cannot control your bowel functions or lose your appetite. °· You develop diarrhea. °SEEK IMMEDIATE MEDICAL CARE IF:  °· You have a  fever. °· You develop a rash. °· You have difficulty breathing or sharp pains in your chest. °· You develop any reaction or side effects to medicines given. °MAKE SURE YOU: °· Understand these instructions. °· Will watch your condition. °· Will get help right away if you are not doing well or get worse. °  °This information is not intended to replace advice given to you by your health care provider. Make sure you discuss any questions you have with your health care provider. °  °Document Released: 09/09/2005 Document Revised: 01/24/2015 Document Reviewed: 02/27/2015 °Elsevier Interactive Patient Education ©2016 [employer] ° °

## 2015-10-18 NOTE — Progress Notes (Signed)
Patient with orders to be discharge home. Discharge instructions given, patient verbalized understanding. Prescriptions given. Patient stable. Patient left in private vehicle with family.  

## 2015-10-18 NOTE — Op Note (Signed)
Patient:  Jason Peters  DOB:  September 07, 1991  MRN:  [id number]   Preop Diagnosis:  Acute appendicitis  Postop Diagnosis:  Same  Procedure:  Laparoscopic appendectomy  Surgeon:  Franky Macho, M.D.  Anes:  Gen. endotracheal  Indications:  Patient is a 25 year old white male who presents with a 24-hour history of worsening right lower quadrant abdominal pain. CT scan the abdomen reveals acute appendicitis. The risks and benefits of the procedure including bleeding, infection, and the possibility of an open procedure were fully explained to the patient, who gave informed consent.  Procedure note:  The patient was placed the supine position. After induction of general endotracheal anesthesia, the abdomen was prepped and draped using the usual sterile technique with DuraPrep. Surgical site confirmation was performed.  A supraumbilical incision was made down to the fascia. A Veress needle was introduced into the abdominal cavity and confirmation of placement was done using the saline drop test. The abdomen was then insufflated to 16 mmHg pressure. An 11 mm trocar was introduced into the abdominal cavity under direct visualization without difficulty. The patient was placed in deeper Trendelenburg position and an additional 12 mm port trocar was placed the suprapubic region and a 5 mm trocar was placed left lower quadrant region. The appendix was visualized and noted to be attached to the abdominal wall. It was elongated and diffusely inflamed except at its base. No evidence of perforation was noted. The mesoappendix was divided using the harmonic scalpel. A vascular Endo GIA was placed across the base the appendix and fired. The staple line was inspected and noted to be intact. The appendix was removed using an Endo Catch bag without difficulty. All fluid and air were then evacuated from the right lower quadrant of the abdomen prior to removal of the trochars.  All wounds were irrigated with normal  saline. All wounds were injected with 0.5% Sensorcaine. The supraumbilical fascia as well as suprapubic fascia were reapproximated using 0 Vicryl interrupted sutures. All skin incisions were closed using staples. Betadine ointment and dry sterile dressings were applied.  All tape and needle counts were correct at the end of the procedure. Patient was extubated in the operating room and transferred to PACU in stable condition.  Complications:  None  EBL:  Minimal  Specimen:  Appendix

## 2015-10-18 NOTE — Anesthesia Procedure Notes (Signed)
Procedure Name: Intubation Date/Time: 10/18/2015 10:57 AM Performed by: Franco Nones Pre-anesthesia Checklist: Patient identified, Patient being monitored, Timeout performed, Emergency Drugs available and Suction available Patient Re-evaluated:Patient Re-evaluated prior to inductionOxygen Delivery Method: Circle System Utilized Preoxygenation: Pre-oxygenation with 100% oxygen Intubation Type: IV induction Ventilation: Mask ventilation without difficulty Laryngoscope Size: Miller and 2 Grade View: Grade I Tube type: Oral Tube size: 7.0 mm Number of attempts: 1 Airway Equipment and Method: Stylet Placement Confirmation: ETT inserted through vocal cords under direct vision,  positive ETCO2 and breath sounds checked- equal and bilateral Secured at: 23 cm Tube secured with: Tape Dental Injury: Teeth and Oropharynx as per pre-operative assessment

## 2015-10-18 NOTE — H&P (Signed)
Jason Peters is an 25 y.o. male.   Chief Complaint: Right lower quadrant abdominal pain HPI: Patient is a 25 year old white male who presents with a 24-hour history of worsening right lower quadrant abdominal pain. CT scan of the abdomen revealed acute appendicitis.  History reviewed. No pertinent past medical history.  Past Surgical History  Procedure Laterality Date  . Hernia repair      History reviewed. No pertinent family history. Social History:  reports that he has been smoking Cigarettes.  He has a 2 pack-year smoking history. His smokeless tobacco use includes Chew. He reports that he does not drink alcohol. His drug history is not on file.  Allergies: No Known Allergies  Medications Prior to Admission  Medication Sig Dispense Refill  . Alpha-D-Galactosidase (BEANO PO) Take 1 tablet by mouth daily as needed (for acid relief).    . famotidine (PEPCID) 20 MG tablet Take 20 mg by mouth daily as needed for heartburn or indigestion.    . naproxen sodium (ANAPROX) 220 MG tablet Take 220 mg by mouth 2 (two) times daily with a meal.    . simethicone (GAS-X) 80 MG chewable tablet Chew 80 mg by mouth every 6 (six) hours as needed for flatulence.      Results for orders placed or performed during the hospital encounter of 10/17/15 (from the past 48 hour(s))  Lipase, blood     Status: None   Collection Time: 10/17/15  9:52 PM  Result Value Ref Range   Lipase 21 11 - 51 U/L  Comprehensive metabolic panel     Status: Abnormal   Collection Time: 10/17/15  9:52 PM  Result Value Ref Range   Sodium 137 135 - 145 mmol/L   Potassium 3.9 3.5 - 5.1 mmol/L   Chloride 100 (L) 101 - 111 mmol/L   CO2 25 22 - 32 mmol/L   Glucose, Bld 126 (H) 65 - 99 mg/dL   BUN 11 6 - 20 mg/dL   Creatinine, Ser 0.94 0.61 - 1.24 mg/dL   Calcium 9.8 8.9 - 10.3 mg/dL   Total Protein 8.2 (H) 6.5 - 8.1 g/dL   Albumin 5.3 (H) 3.5 - 5.0 g/dL   AST 24 15 - 41 U/L   ALT 23 17 - 63 U/L   Alkaline Phosphatase 58 38  - 126 U/L   Total Bilirubin 2.0 (H) 0.3 - 1.2 mg/dL   GFR calc non Af Amer >60 >60 mL/min   GFR calc Af Amer >60 >60 mL/min    Comment: (NOTE) The eGFR has been calculated using the CKD EPI equation. This calculation has not been validated in all clinical situations. eGFR's persistently <60 mL/min signify possible Chronic Kidney Disease.    Anion gap 12 5 - 15  CBC     Status: Abnormal   Collection Time: 10/17/15  9:52 PM  Result Value Ref Range   WBC 15.8 (H) 4.0 - 10.5 K/uL   RBC 5.54 4.22 - 5.81 MIL/uL   Hemoglobin 16.9 13.0 - 17.0 g/dL   HCT 46.1 39.0 - 52.0 %   MCV 83.2 78.0 - 100.0 fL   MCH 30.5 26.0 - 34.0 pg   MCHC 36.7 (H) 30.0 - 36.0 g/dL   RDW 11.9 11.5 - 15.5 %   Platelets 237 150 - 400 K/uL  Surgical pcr screen     Status: None   Collection Time: 10/18/15 12:20 AM  Result Value Ref Range   MRSA, PCR NEGATIVE NEGATIVE   Staphylococcus aureus NEGATIVE NEGATIVE  Comment:        The Xpert SA Assay (FDA approved for NASAL specimens in patients over 73 years of age), is one component of a comprehensive surveillance program.  Test performance has been validated by [facility] for patients greater than or equal to 42 year old. It is not intended to diagnose infection nor to guide or monitor treatment.   Urinalysis, Routine w reflex microscopic (not at [facility])     Status: Abnormal   Collection Time: 10/18/15  1:00 AM  Result Value Ref Range   Color, Urine YELLOW YELLOW   APPearance CLEAR CLEAR   Specific Gravity, Urine <1.005 (L) 1.005 - 1.030   pH 6.5 5.0 - 8.0   Glucose, UA NEGATIVE NEGATIVE mg/dL   Hgb urine dipstick NEGATIVE NEGATIVE   Bilirubin Urine NEGATIVE NEGATIVE   Ketones, ur NEGATIVE NEGATIVE mg/dL   Protein, ur NEGATIVE NEGATIVE mg/dL   Nitrite NEGATIVE NEGATIVE   Leukocytes, UA NEGATIVE NEGATIVE    Comment: MICROSCOPIC NOT DONE ON URINES WITH NEGATIVE PROTEIN, BLOOD, LEUKOCYTES, NITRITE, OR GLUCOSE <1000 mg/dL.   Ct Abdomen Pelvis W  Contrast  10/17/2015  CLINICAL DATA:  Right lower quadrant pain since yesterday. Initial encounter. EXAM: CT ABDOMEN AND PELVIS WITH CONTRAST TECHNIQUE: Multidetector CT imaging of the abdomen and pelvis was performed using the standard protocol following bolus administration of intravenous contrast. CONTRAST:  100 mL OMNIPAQUE IOHEXOL 300 MG/ML  SOLN COMPARISON:  None. FINDINGS: Lung bases are clear.  No pleural or pericardial effusion. The appendix is dilated with periappendiceal stranding consistent with acute appendicitis. No abscess or perforation is identified. The stomach and small and large bowel appear normal. The gallbladder, liver, spleen, adrenal glands, kidneys and pancreas appear normal. There is no lymphadenopathy or fluid. No focal bony abnormality is seen. IMPRESSION: Acute appendicitis without abscess or perforation. These results were called by telephone at the time of interpretation on 10/17/2015 at 11:17 pm to [place], N.P. , who verbally acknowledged these results. Electronically Signed   By: Inge Rise M.D.   On: 10/17/2015 23:19    Review of Systems  Constitutional: Positive for malaise/fatigue.  HENT: Negative.   Eyes: Negative.   Respiratory: Negative.   Cardiovascular: Negative.   Gastrointestinal: Positive for nausea and abdominal pain.  Genitourinary: Negative.   Musculoskeletal: Negative.   Skin: Negative.   All other systems reviewed and are negative.   Blood pressure 106/62, pulse 90, temperature 99.2 F (37.3 C), temperature source Oral, resp. rate 16, height _0  (1.88 m), weight 97.51 kg (214 lb 15.5 oz), SpO2 97 %. Physical Exam  Vitals reviewed. Constitutional: He is oriented to person, place, and time. He appears well-developed and well-nourished.  HENT:  Head: Normocephalic and atraumatic.  Neck: Normal range of motion. Neck supple.  Cardiovascular: Normal rate, regular rhythm and normal heart sounds.   Respiratory: Effort normal and breath  sounds normal.  GI: Soft. There is tenderness.  Tender in the right lower quadrant to palpation. No rigidity noted.  Neurological: He is alert and oriented to person, place, and time.  Skin: Skin is warm and dry.     Assessment/Plan Impression: Acute appendicitis Plan: Patient will be taken to the operating room for laparoscopic appendectomy. The risks and benefits of the procedure including bleeding, infection, and the possibility of an open procedure were fully explained to the patient, who gave informed consent.  Field Staniszewski A 10/18/2015, 7:15 AM

## 2015-10-18 NOTE — Care Management Note (Signed)
Case Management Note  Patient Details  Name: Jason Peters MRN: [id number] Date of Birth: [birth year]  Subjective/Objective:                  Pt is from home and is ind with ADL's. Pt plans to return home with self care. Pt has no DME or HH prior to admission or at DC. Pt has no f/u or med needs at DC.   Action/Plan: No CM needs.   Expected Discharge Date:  10/20/15               Expected Discharge Plan:  Home/Self Care  In-House Referral:  NA  Discharge planning Services  CM Consult  Post Acute Care Choice:  NA Choice offered to:  NA  DME Arranged:    DME Agency:     HH Arranged:    HH Agency:     Status of Service:  Completed, signed off  Medicare Important Message Given:    Date Medicare IM Given:    Medicare IM give by:    Date Additional Medicare IM Given:    Additional Medicare Important Message give by:     If discussed at Long Length of Stay Meetings, dates discussed:    Additional Comments:  Malcolm Metro, RN 10/18/2015, 1:20 PM

## 2015-10-19 ENCOUNTER — Encounter (HOSPITAL_COMMUNITY): Payer: Self-pay | Admitting: General Surgery

## 2017-05-08 ENCOUNTER — Encounter (HOSPITAL_COMMUNITY): Payer: Self-pay | Admitting: Emergency Medicine

## 2017-05-08 ENCOUNTER — Emergency Department (HOSPITAL_COMMUNITY)
Admission: EM | Admit: 2017-05-08 | Discharge: 2017-05-08 | Disposition: A | Discharge status: Home / Self Care | Payer: BC Managed Care – PPO | Attending: Emergency Medicine | Admitting: Emergency Medicine

## 2017-05-08 ENCOUNTER — Emergency Department (HOSPITAL_COMMUNITY): Payer: BC Managed Care – PPO

## 2017-05-08 DIAGNOSIS — R1012 Left upper quadrant pain: Secondary | ICD-10-CM

## 2017-05-08 DIAGNOSIS — F17210 Nicotine dependence, cigarettes, uncomplicated: Secondary | ICD-10-CM | POA: Insufficient documentation

## 2017-05-08 DIAGNOSIS — R319 Hematuria, unspecified: Secondary | ICD-10-CM | POA: Insufficient documentation

## 2017-05-08 DIAGNOSIS — Z79899 Other long term (current) drug therapy: Secondary | ICD-10-CM | POA: Insufficient documentation

## 2017-05-08 LAB — CBC WITH DIFFERENTIAL/PLATELET
Basophils Absolute: 0 10*3/uL (ref 0.0–0.1)
Basophils Relative: 0 %
Eosinophils Absolute: 0.1 10*3/uL (ref 0.0–0.7)
Eosinophils Relative: 1 %
HCT: 41.2 % (ref 39.0–52.0)
Hemoglobin: 14.9 g/dL (ref 13.0–17.0)
Lymphocytes Relative: 33 %
Lymphs Abs: 2.8 10*3/uL (ref 0.7–4.0)
MCH: 29.7 pg (ref 26.0–34.0)
MCHC: 36.2 g/dL — ABNORMAL HIGH (ref 30.0–36.0)
MCV: 82.2 fL (ref 78.0–100.0)
Monocytes Absolute: 0.7 10*3/uL (ref 0.1–1.0)
Monocytes Relative: 8 %
Neutro Abs: 5 10*3/uL (ref 1.7–7.7)
Neutrophils Relative %: 58 %
Platelets: 247 10*3/uL (ref 150–400)
RBC: 5.01 MIL/uL (ref 4.22–5.81)
RDW: 11.9 % (ref 11.5–15.5)
WBC: 8.5 10*3/uL (ref 4.0–10.5)

## 2017-05-08 LAB — URINALYSIS, ROUTINE W REFLEX MICROSCOPIC
Bilirubin Urine: NEGATIVE
Glucose, UA: NEGATIVE mg/dL
Ketones, ur: NEGATIVE mg/dL
Nitrite: NEGATIVE
Protein, ur: 30 mg/dL — AB
Specific Gravity, Urine: 1.028 (ref 1.005–1.030)
Squamous Epithelial / LPF: NONE SEEN
pH: 5 (ref 5.0–8.0)

## 2017-05-08 LAB — COMPREHENSIVE METABOLIC PANEL
ALT: 14 U/L — ABNORMAL LOW (ref 17–63)
AST: 16 U/L (ref 15–41)
Albumin: 4.9 g/dL (ref 3.5–5.0)
Alkaline Phosphatase: 49 U/L (ref 38–126)
Anion gap: 10 (ref 5–15)
BUN: 12 mg/dL (ref 6–20)
CO2: 25 mmol/L (ref 22–32)
Calcium: 9.6 mg/dL (ref 8.9–10.3)
Chloride: 105 mmol/L (ref 101–111)
Creatinine, Ser: 0.93 mg/dL (ref 0.61–1.24)
GFR calc Af Amer: >60 mL/min (ref >60)
GFR calc non Af Amer: >60 mL/min (ref >60)
Glucose, Bld: 94 mg/dL (ref 65–99)
Potassium: 3.5 mmol/L (ref 3.5–5.1)
Sodium: 140 mmol/L (ref 135–145)
Total Bilirubin: 2.1 mg/dL — ABNORMAL HIGH (ref 0.3–1.2)
Total Protein: 7.4 g/dL (ref 6.5–8.1)

## 2017-05-08 LAB — LIPASE, BLOOD: Lipase: 19 U/L (ref 11–51)

## 2017-05-08 MED ORDER — GI COCKTAIL ~~LOC~~
30.0000 mL | Freq: Once | ORAL | Status: AC
  Administered 2017-05-08: 30 mL via ORAL
  Filled 2017-05-08: qty 30

## 2017-05-08 NOTE — ED Provider Notes (Signed)
[facility]PT Provider Note   CSN: [id number] Arrival date & time: 05/08/17  0746     History   Chief Complaint Chief Complaint  Patient presents with  . Abdominal Pain    HPI Jason Peters is a 26 y.o. male.  HPI Patient presents with upper abdominal pain. Left upper quadrant. Has had it for the last couple days. Initially came and went but now is been constant. Not changed with eating. It is dull. It is severe enough that he wanted to come in the hospital. Had a appendicitis around 7 months ago and did not want to come in the hospital then. No fevers. Not changed with eating. No diarrhea or constipation. No nausea vomiting. History reviewed. No pertinent past medical history.  Patient Active Problem List   Diagnosis Date Noted  . Acute appendicitis 10/17/2015    Past Surgical History:  Procedure Laterality Date  . HERNIA REPAIR    . LAPAROSCOPIC APPENDECTOMY N/A 10/18/2015   Procedure: APPENDECTOMY LAPAROSCOPIC;  Surgeon: Franky Macho, MD;  Location: [facility];  Service: General;  Laterality: N/A;       Home Medications    Prior to Admission medications   Medication Sig Start Date End Date Taking? Authorizing Provider  Alpha-D-Galactosidase (BEANO PO) Take 1 tablet by mouth daily as needed (for acid relief).    [provider]  famotidine (PEPCID) 20 MG tablet Take 20 mg by mouth daily as needed for heartburn or indigestion.    [provider]  naproxen sodium (ANAPROX) 220 MG tablet Take 220 mg by mouth 2 (two) times daily with a meal.    [provider]  oxyCODONE-acetaminophen (PERCOCET) 7.5-325 MG tablet Take 1-2 tablets by mouth every 4 (four) hours as needed. 10/18/15   Franky Macho, MD  simethicone (GAS-X) 80 MG chewable tablet Chew 80 mg by mouth every 6 (six) hours as needed for flatulence.    [provider]    Family History No family history on file.  Social History Social History  Substance Use Topics  .  Smoking status: Current Every Day Smoker    Packs/day: 1.00    Years: 2.00    Types: Cigarettes  . Smokeless tobacco: Current User    Types: Chew  . Alcohol use No     Allergies   Sulfa antibiotics   Review of Systems Review of Systems  Constitutional: Negative for appetite change, fatigue and fever.  HENT: Negative for congestion.   Respiratory: Negative for shortness of breath.   Cardiovascular: Negative for chest pain.  Gastrointestinal: Positive for abdominal pain. Negative for constipation, diarrhea, nausea and vomiting.  Genitourinary: Negative for frequency.  Musculoskeletal: Negative for back pain.  Skin: Negative for rash.  Neurological: Negative for numbness.  Hematological: Negative for adenopathy.  Psychiatric/Behavioral: Negative for decreased concentration.     Physical Exam Updated Vital Signs BP 108/68 (BP Location: Left Arm)   Pulse 74   Temp 98 F (36.7 C) (Oral)   Resp 18   Ht 6\' 2"  (1.88 m)   Wt 101.6 kg (224 lb)   SpO2 98%   BMI 28.76 kg/m   Physical Exam  Constitutional: He is oriented to person, place, and time. He appears well-developed and well-nourished.  HENT:  Head: Normocephalic and atraumatic.  Eyes: Pupils are equal, round, and reactive to light.  Cardiovascular: Normal rate and regular rhythm.   No murmur heard. Pulmonary/Chest: Effort normal and breath sounds normal.  Abdominal: Soft. He exhibits no distension and no mass.  There is tenderness. There is no rebound and no guarding.  Mild left upper quadrant tenderness without rebound or guarding. No mass.  Musculoskeletal: Normal range of motion. He exhibits no edema.  Neurological: He is alert and oriented to person, place, and time. No cranial nerve deficit.  Skin: Skin is warm and dry.  Nursing note and vitals reviewed.    ED Treatments / Results  Labs (all labs ordered are listed, but only abnormal results are displayed) Labs Reviewed  COMPREHENSIVE METABOLIC PANEL -  Abnormal; Notable for the following:       Result Value   ALT 14 (*)    Total Bilirubin 2.1 (*)    All other components within normal limits  CBC WITH DIFFERENTIAL/PLATELET - Abnormal; Notable for the following:    MCHC 36.2 (*)    All other components within normal limits  URINALYSIS, ROUTINE W REFLEX MICROSCOPIC - Abnormal; Notable for the following:    APPearance HAZY (*)    Hgb urine dipstick LARGE (*)    Protein, ur 30 (*)    Leukocytes, UA TRACE (*)    Bacteria, UA RARE (*)    All other components within normal limits  LIPASE, BLOOD    EKG  EKG Interpretation None       Radiology [place]  Result Date: 05/08/2017 CLINICAL DATA:  Left flank pain and hematuria EXAM: RENAL / URINARY TRACT ULTRASOUND COMPLETE COMPARISON:  CT abdomen and pelvis October 17, 2015 FINDINGS: Right Kidney: Length: 11.7 cm. Echogenicity and renal cortical thickness are within normal limits. No mass, perinephric fluid, or hydronephrosis visualized. No sonographically demonstrable calculus or ureterectasis. Left Kidney: Length: 12.9 cm. Echogenicity and renal cortical thickness are within normal limits. No mass, perinephric fluid, or hydronephrosis visualized. No sonographically demonstrable calculus or ureterectasis. Bladder: Appears normal for degree of bladder distention. IMPRESSION: Study within normal limits. Electronically Signed   By: Bretta BangWilliam  Woodruff III M.D.   On: 05/08/2017 12:17    Procedures Procedures (including critical care time)  Medications Ordered in ED Medications  gi cocktail (Maalox,Lidocaine,Donnatal) (30 mLs Oral Given 05/08/17 0839)     Initial Impression / Assessment and Plan / ED Course  I have reviewed the triage vital signs and the nursing notes.  Pertinent labs & imaging results that were available during my care of the patient were reviewed by me and considered in my medical decision making (see chart for details).     Patient with abdominal pain. Upper abdomen.  Not worse with movement or food. Had some hematuria on labs. Recent CT scan showed appendicitis but no left upper quadrant abnormality. Ultrasound done and showed no renal abnormality. Will discharge home follow-up with urology.  Final Clinical Impressions(s) / ED Diagnoses   Final diagnoses:  Left upper quadrant pain  Hematuria, unspecified type    New Prescriptions Discharge Medication List as of 05/08/2017 12:51 PM       Benjiman CorePickering, Murle Hellstrom, MD 05/08/17 1326

## 2017-05-08 NOTE — ED Triage Notes (Signed)
Pt c/o epigastric pain x 2 days. Denies n/v/d.

## 2018-04-02 ENCOUNTER — Emergency Department (HOSPITAL_COMMUNITY): Payer: BLUE CROSS/BLUE SHIELD

## 2018-04-02 ENCOUNTER — Observation Stay (HOSPITAL_COMMUNITY)
Admission: EM | Admit: 2018-04-02 | Discharge: 2018-04-03 | Disposition: A | Discharge status: Home / Self Care | Payer: BLUE CROSS/BLUE SHIELD | Attending: Internal Medicine | Admitting: Internal Medicine

## 2018-04-02 ENCOUNTER — Encounter (HOSPITAL_COMMUNITY): Payer: Self-pay | Admitting: *Deleted

## 2018-04-02 ENCOUNTER — Other Ambulatory Visit: Payer: Self-pay

## 2018-04-02 DIAGNOSIS — F1390 Sedative, hypnotic, or anxiolytic use, unspecified, uncomplicated: Secondary | ICD-10-CM

## 2018-04-02 DIAGNOSIS — F17210 Nicotine dependence, cigarettes, uncomplicated: Secondary | ICD-10-CM | POA: Insufficient documentation

## 2018-04-02 DIAGNOSIS — R410 Disorientation, unspecified: Secondary | ICD-10-CM | POA: Diagnosis not present

## 2018-04-02 DIAGNOSIS — G9340 Encephalopathy, unspecified: Secondary | ICD-10-CM | POA: Diagnosis not present

## 2018-04-02 DIAGNOSIS — F1310 Sedative, hypnotic or anxiolytic abuse, uncomplicated: Secondary | ICD-10-CM | POA: Diagnosis not present

## 2018-04-02 DIAGNOSIS — Z720 Tobacco use: Secondary | ICD-10-CM | POA: Diagnosis not present

## 2018-04-02 DIAGNOSIS — R4182 Altered mental status, unspecified: Secondary | ICD-10-CM | POA: Diagnosis present

## 2018-04-02 HISTORY — DX: Calculus of kidney: N20.0

## 2018-04-02 LAB — CBC WITH DIFFERENTIAL/PLATELET
Basophils Absolute: 0 10*3/uL (ref 0.0–0.1)
Basophils Relative: 0 %
Eosinophils Absolute: 0.1 10*3/uL (ref 0.0–0.7)
Eosinophils Relative: 1 %
HCT: 41.4 % (ref 39.0–52.0)
Hemoglobin: 15.1 g/dL (ref 13.0–17.0)
Lymphocytes Relative: 30 %
Lymphs Abs: 1.7 10*3/uL (ref 0.7–4.0)
MCH: 30.6 pg (ref 26.0–34.0)
MCHC: 36.5 g/dL — ABNORMAL HIGH (ref 30.0–36.0)
MCV: 83.8 fL (ref 78.0–100.0)
Monocytes Absolute: 0.5 10*3/uL (ref 0.1–1.0)
Monocytes Relative: 9 %
Neutro Abs: 3.4 10*3/uL (ref 1.7–7.7)
Neutrophils Relative %: 60 %
Platelets: 209 10*3/uL (ref 150–400)
RBC: 4.94 MIL/uL (ref 4.22–5.81)
RDW: 11.7 % (ref 11.5–15.5)
WBC: 5.6 10*3/uL (ref 4.0–10.5)

## 2018-04-02 LAB — RAPID URINE DRUG SCREEN, HOSP PERFORMED
Amphetamines: NOT DETECTED
Benzodiazepines: POSITIVE — AB
Cocaine: NOT DETECTED
Opiates: NOT DETECTED
Tetrahydrocannabinol: NOT DETECTED

## 2018-04-02 LAB — BILIRUBIN, FRACTIONATED(TOT/DIR/INDIR)
Bilirubin, Direct: 0.2 mg/dL (ref 0.0–0.2)
Indirect Bilirubin: 1.9 mg/dL — ABNORMAL HIGH (ref 0.3–0.9)
Total Bilirubin: 2.1 mg/dL — ABNORMAL HIGH (ref 0.3–1.2)

## 2018-04-02 LAB — COMPREHENSIVE METABOLIC PANEL
ALT: 11 U/L (ref 0–44)
AST: 15 U/L (ref 15–41)
Albumin: 5 g/dL (ref 3.5–5.0)
Alkaline Phosphatase: 52 U/L (ref 38–126)
Anion gap: 8 (ref 5–15)
BUN: 8 mg/dL (ref 6–20)
CO2: 28 mmol/L (ref 22–32)
Calcium: 9.6 mg/dL (ref 8.9–10.3)
Chloride: 103 mmol/L (ref 98–111)
Creatinine, Ser: 0.93 mg/dL (ref 0.61–1.24)
GFR calc Af Amer: >60 mL/min (ref >60)
GFR calc non Af Amer: >60 mL/min (ref >60)
Glucose, Bld: 90 mg/dL (ref 70–99)
Potassium: 3.5 mmol/L (ref 3.5–5.1)
Sodium: 139 mmol/L (ref 135–145)
Total Bilirubin: 2.8 mg/dL — ABNORMAL HIGH (ref 0.3–1.2)
Total Protein: 7.6 g/dL (ref 6.5–8.1)

## 2018-04-02 LAB — URINALYSIS, ROUTINE W REFLEX MICROSCOPIC
Bilirubin Urine: NEGATIVE
Glucose, UA: NEGATIVE mg/dL
Hgb urine dipstick: NEGATIVE
Ketones, ur: NEGATIVE mg/dL
Leukocytes, UA: NEGATIVE
Nitrite: NEGATIVE
Protein, ur: NEGATIVE mg/dL
Specific Gravity, Urine: 1.006 (ref 1.005–1.030)
pH: 8 (ref 5.0–8.0)

## 2018-04-02 LAB — BLOOD GAS, ARTERIAL
Acid-base deficit: 0.9 mmol/L (ref 0.0–2.0)
Bicarbonate: 24 mmol/L (ref 20.0–28.0)
Drawn by: 331001
FIO2: 21
O2 Saturation: 98.4 %
Patient temperature: 98.3
pCO2 arterial: 35 mmHg (ref 32.0–48.0)
pH, Arterial: 7.43 (ref 7.350–7.450)
pO2, Arterial: 110 mmHg — ABNORMAL HIGH (ref 83.0–108.0)

## 2018-04-02 LAB — ETHANOL: Alcohol, Ethyl (B): <10 mg/dL (ref <10)

## 2018-04-02 LAB — ACETAMINOPHEN LEVEL: Acetaminophen (Tylenol), Serum: <10 ug/mL — ABNORMAL LOW (ref 10–30)

## 2018-04-02 LAB — SALICYLATE LEVEL: Salicylate Lvl: <7 mg/dL (ref 2.8–30.0)

## 2018-04-02 LAB — TSH: TSH: 0.572 u[IU]/mL (ref 0.350–4.500)

## 2018-04-02 LAB — TROPONIN I: Troponin I: <0.03 ng/mL (ref <0.03)

## 2018-04-02 LAB — AMMONIA: Ammonia: 17 umol/L (ref 9–35)

## 2018-04-02 LAB — LIPASE, BLOOD: Lipase: 22 U/L (ref 11–51)

## 2018-04-02 LAB — CBG MONITORING, ED: Glucose-Capillary: 93 mg/dL (ref 70–99)

## 2018-04-02 MED ORDER — SENNOSIDES-DOCUSATE SODIUM 8.6-50 MG PO TABS: 1.0000 | ORAL_TABLET | Freq: Every evening | ORAL | Status: DC | PRN

## 2018-04-02 MED ORDER — ASPIRIN 325 MG PO TABS
325.0000 mg | ORAL_TABLET | Freq: Every day | ORAL | Status: DC
  Administered 2018-04-02 – 2018-04-03 (×2): 325 mg via ORAL
  Filled 2018-04-02 (×2): qty 1

## 2018-04-02 MED ORDER — SODIUM CHLORIDE 0.9 % IV BOLUS
1000.0000 mL | Freq: Once | INTRAVENOUS | Status: AC
Start: 2018-04-02 — End: 2018-04-02
  Administered 2018-04-02: 1000 mL via INTRAVENOUS

## 2018-04-02 MED ORDER — NICOTINE 21 MG/24HR TD PT24
21.0000 mg | MEDICATED_PATCH | Freq: Every day | TRANSDERMAL | Status: DC
  Administered 2018-04-02 – 2018-04-03 (×2): 21 mg via TRANSDERMAL
  Filled 2018-04-02 (×2): qty 1

## 2018-04-02 MED ORDER — POTASSIUM CHLORIDE IN NACL 20-0.9 MEQ/L-% IV SOLN
INTRAVENOUS | Status: AC
  Administered 2018-04-02: 20:00:00 via INTRAVENOUS
  Filled 2018-04-02: qty 1000

## 2018-04-02 MED ORDER — ENOXAPARIN SODIUM 40 MG/0.4ML ~~LOC~~ SOLN
40.0000 mg | SUBCUTANEOUS | Status: DC
  Administered 2018-04-02: 40 mg via SUBCUTANEOUS
  Filled 2018-04-02: qty 0.4

## 2018-04-02 MED ORDER — ACETAMINOPHEN 650 MG RE SUPP: 650.0000 mg | Freq: Four times a day (QID) | RECTAL | Status: DC | PRN

## 2018-04-02 MED ORDER — ONDANSETRON HCL 4 MG/2ML IJ SOLN: 4.0000 mg | Freq: Four times a day (QID) | INTRAMUSCULAR | Status: DC | PRN

## 2018-04-02 MED ORDER — ACETAMINOPHEN 325 MG PO TABS: 650.0000 mg | ORAL_TABLET | Freq: Four times a day (QID) | ORAL | Status: DC | PRN

## 2018-04-02 MED ORDER — SODIUM CHLORIDE 0.9% FLUSH
3.0000 mL | Freq: Two times a day (BID) | INTRAVENOUS | Status: DC
  Administered 2018-04-02 – 2018-04-03 (×2): 3 mL via INTRAVENOUS

## 2018-04-02 MED ORDER — ONDANSETRON HCL 4 MG PO TABS: 4.0000 mg | ORAL_TABLET | Freq: Four times a day (QID) | ORAL | Status: DC | PRN

## 2018-04-02 NOTE — ED Notes (Signed)
Patient to MRI.

## 2018-04-02 NOTE — ED Notes (Signed)
Patient completed Neurology consult.

## 2018-04-02 NOTE — ED Notes (Signed)
Pt was informed that we need a urine sample. Pt states that he can not urinate at this time. Pt was given a urinal. 

## 2018-04-02 NOTE — ED Triage Notes (Addendum)
Pt's wife reports pt starting having confusion, slurred speech, unsteady gait that started last night intermittently. Pt's wife reports he woke up this morning and was fine. Pt went to work and they called 911 because pt was lethargic, slurring speech, and had unsteady gait. Pt alert to self, place, time. Pt denies pain and weakness. Denies taking any medications and drugs.   Pt leaned over in wheelchair as if he's very tired. Pt will answer questions appropriately but with slow responses.

## 2018-04-02 NOTE — ED Provider Notes (Signed)
[facility] EMERGENCY DEPARTMENT Provider Note   CSN: [id number] Arrival date & time: 04/02/18  1411     History   Chief Complaint Chief Complaint  Patient presents with  . Altered Mental Status    HPI Jason Peters is a 27 y.o. male.  The history is provided by the spouse and a relative. The history is limited by the condition of the patient (AMS).  Altered Mental Status    Pt was seen at 1510. Per pt's family and father:  Pt's wife came home from work and shortly afterwards, approximately 1930/2030 last night, she noticed that pt began to have slurred speech, unsteady gait, confusion. Pt woke up with symptoms this morning "but they weren't as bad, so he went to work. Pt's wife states pt's employer called 911 because pt's symptoms worsened again approximately 1000/1100 today. Pt's wife states he had N/V x1 this morning. Pt's father states pt has been "under a lot of stress lately."  Pt was outside smoking before escorted to exam room. When placed in wheelchair, pt leaned over and answered questions slowly. Pt has eyes open and was saying "hmmm" on my arrival to exam room. Denies injuries, new meds, or illegal or rx drugs. Denies fevers, cough, diarrhea. No black or blood in stools or emesis. No CP/SOB, no abd pain, no headache, no focal motor weakness.   Past Medical History:  Diagnosis Date  . Kidney stone     Patient Active Problem List   Diagnosis Date Noted  . Acute appendicitis 10/17/2015    Past Surgical History:  Procedure Laterality Date  . HERNIA REPAIR    . LAPAROSCOPIC APPENDECTOMY N/A 10/18/2015   Procedure: APPENDECTOMY LAPAROSCOPIC;  Surgeon: Franky Macho, MD;  Location: [facility];  Service: General;  Laterality: N/A;        Home Medications    Prior to Admission medications   Not on File    Family History No family history on file.  Social History Social History   Tobacco Use  . Smoking status: Current Every Day Smoker    Packs/day: 1.00   Years: 2.00    Pack years: 2.00    Types: Cigarettes  . Smokeless tobacco: Current User    Types: Chew  Substance Use Topics  . Alcohol use: No  . Drug use: No     Allergies   Sulfa antibiotics   Review of Systems Review of Systems  Unable to perform ROS: Mental status change     Physical Exam Updated Vital Signs BP 139/87 (BP Location: Right Arm)   Pulse 79   Temp 98.3 F (36.8 C) (Oral)   Resp 14   Ht 6\' 2"  (1.88 m)   Wt 95.3 kg (210 lb)   SpO2 100%   BMI 26.96 kg/m    Patient Vitals for the past 24 hrs:  BP Temp Temp src Pulse Resp SpO2 Height Weight  04/02/18 1807 (!) 137/96 - - - - 99 % - -  04/02/18 1755 138/81 - - 88 12 100 % - -  04/02/18 1630 115/88 - - - 12 - - -  04/02/18 1600 126/77 - - (!) 54 20 100 % - -  04/02/18 1456 139/87 98.3 F (36.8 C) Oral 79 14 100 % 6\' 2"  (1.88 m) 95.3 kg (210 lb)     15:16 Orthostatic Vital Signs TH  Orthostatic Lying   BP- Lying: 127/78   Pulse- Lying: 65       Orthostatic Sitting  BP- Sitting:  131/84   Pulse- Sitting: 67       Orthostatic Standing at 0 minutes  BP- Standing at 0 minutes: 122/76   Pulse- Standing at 0 minutes: 86     Physical Exam 1515: Physical examination:  Nursing notes reviewed; Vital signs and O2 SAT reviewed;  Constitutional: Well developed, Well nourished, Well hydrated, In no acute distress; Head:  Normocephalic, atraumatic; Eyes: EOMI, PERRL, No scleral icterus; ENMT: Mouth and pharynx normal, Mucous membranes moist; Neck: Supple, Full range of motion, No lymphadenopathy; Cardiovascular: Regular rate and rhythm, No gallop; Respiratory: Breath sounds clear & equal bilaterally, No wheezes. Normal respiratory effort/excursion; Chest: Nontender, Movement normal; Abdomen: Soft, Nontender, Nondistended, Normal bowel sounds; Genitourinary: No CVA tenderness; Extremities: Peripheral pulses normal, No tenderness, No edema, No calf edema or asymmetry.; Neuro: Sitting upright, eyes open, looking  around exam room. Follows commands. Says "hmmm" to questions. +right horizontal end gaze fatigable nystagmus.  Major CN grossly intact. No facial droop. Moves all extremities spontaneously and to command without apparent gross focal motor deficits.; Skin: Color normal, Warm, Dry.   ED Treatments / Results  Labs (all labs ordered are listed, but only abnormal results are displayed)   EKG EKG Interpretation  Date/Time:  Thursday April 02 2018 15:08:17 EDT Ventricular Rate:  68 PR Interval:    QRS Duration: 81 QT Interval:  365 QTC Calculation: 389 R Axis:   84 Text Interpretation:  Sinus rhythm No old tracing to compare Confirmed by Samuel JesterMcManus, Joella Saefong [phone]) on 04/02/2018 3:14:13 PM   Radiology   Procedures Procedures (including critical care time)  Medications Ordered in ED Medications - No data to display   Initial Impression / Assessment and Plan / ED Course  I have reviewed the triage vital signs and the nursing notes.  Pertinent labs & imaging results that were available during my care of the patient were reviewed by me and considered in my medical decision making (see chart for details).  MDM Reviewed: previous chart, nursing note and vitals Reviewed previous: labs Interpretation: labs, ECG, x-ray and CT scan    Results for orders placed or performed during the hospital encounter of 04/02/18  Comprehensive metabolic panel  Result Value Ref Range   Sodium 139 135 - 145 mmol/L   Potassium 3.5 3.5 - 5.1 mmol/L   Chloride 103 98 - 111 mmol/L   CO2 28 22 - 32 mmol/L   Glucose, Bld 90 70 - 99 mg/dL   BUN 8 6 - 20 mg/dL   Creatinine, Ser 6.040.93 0.61 - 1.24 mg/dL   Calcium 9.6 8.9 - 54.010.3 mg/dL   Total Protein 7.6 6.5 - 8.1 g/dL   Albumin 5.0 3.5 - 5.0 g/dL   AST 15 15 - 41 U/L   ALT 11 0 - 44 U/L   Alkaline Phosphatase 52 38 - 126 U/L   Total Bilirubin 2.8 (H) 0.3 - 1.2 mg/dL   GFR calc non Af Amer >60 >60 mL/min   GFR calc Af Amer >60 >60 mL/min   Anion gap 8 5  - 15  Urinalysis, Routine w reflex microscopic  Result Value Ref Range   Color, Urine STRAW (A) YELLOW   APPearance CLEAR CLEAR   Specific Gravity, Urine 1.006 1.005 - 1.030   pH 8.0 5.0 - 8.0   Glucose, UA NEGATIVE NEGATIVE mg/dL   Hgb urine dipstick NEGATIVE NEGATIVE   Bilirubin Urine NEGATIVE NEGATIVE   Ketones, ur NEGATIVE NEGATIVE mg/dL   Protein, ur NEGATIVE NEGATIVE mg/dL   Nitrite  NEGATIVE NEGATIVE   Leukocytes, UA NEGATIVE NEGATIVE  Rapid urine drug screen (hospital performed)  Result Value Ref Range   Opiates NONE DETECTED NONE DETECTED   Cocaine NONE DETECTED NONE DETECTED   Benzodiazepines POSITIVE (A) NONE DETECTED   Amphetamines NONE DETECTED NONE DETECTED   Tetrahydrocannabinol NONE DETECTED NONE DETECTED   Barbiturates (A) NONE DETECTED    Result not available. Reagent lot number recalled by manufacturer.  CBC with Differential/Platelet  Result Value Ref Range   WBC 5.6 4.0 - 10.5 K/uL   RBC 4.94 4.22 - 5.81 MIL/uL   Hemoglobin 15.1 13.0 - 17.0 g/dL   HCT 16.1 09.6 - 04.5 %   MCV 83.8 78.0 - 100.0 fL   MCH 30.6 26.0 - 34.0 pg   MCHC 36.5 (H) 30.0 - 36.0 g/dL   RDW 40.9 81.1 - 91.4 %   Platelets 209 150 - 400 K/uL   Neutrophils Relative % 60 %   Neutro Abs 3.4 1.7 - 7.7 K/uL   Lymphocytes Relative 30 %   Lymphs Abs 1.7 0.7 - 4.0 K/uL   Monocytes Relative 9 %   Monocytes Absolute 0.5 0.1 - 1.0 K/uL   Eosinophils Relative 1 %   Eosinophils Absolute 0.1 0.0 - 0.7 K/uL   Basophils Relative 0 %   Basophils Absolute 0.0 0.0 - 0.1 K/uL  Acetaminophen level  Result Value Ref Range   Acetaminophen (Tylenol), Serum <10 (L) 10 - 30 ug/mL  Ethanol  Result Value Ref Range   Alcohol, Ethyl (B) <10 <10 mg/dL  Salicylate level  Result Value Ref Range   Salicylate Lvl <7.0 2.8 - 30.0 mg/dL  Troponin I  Result Value Ref Range   Troponin I <0.03 <0.03 ng/mL  Lipase, blood  Result Value Ref Range   Lipase 22 11 - 51 U/L  Blood gas, arterial  Result Value Ref  Range   FIO2 21.00    Delivery systems ROOM AIR    pH, Arterial 7.430 7.350 - 7.450   pCO2 arterial 35.0 32.0 - 48.0 mmHg   pO2, Arterial 110 (H) 83.0 - 108.0 mmHg   Bicarbonate 24.0 20.0 - 28.0 mmol/L   Acid-base deficit 0.9 0.0 - 2.0 mmol/L   O2 Saturation 98.4 %   Patient temperature 98.3    Collection site LEFT RADIAL    Drawn by [id number]    Sample type ARTERIAL DRAW    Allens test (pass/fail) PASS PASS  CBG monitoring, ED  Result Value Ref Range   Glucose-Capillary 93 70 - 99 mg/dL   Dg Chest 2 View Result Date: 04/02/2018 CLINICAL DATA:  27 year old male with a history of lethargy and slurred speech EXAM: CHEST - 2 VIEW COMPARISON:  None. FINDINGS: The heart size and mediastinal contours are within normal limits. Both lungs are clear. The visualized skeletal structures are unremarkable. IMPRESSION: Negative for acute cardiopulmonary disease Electronically Signed   By: Gilmer Mor D.O.   On: 04/02/2018 15:37   Ct Head Wo Contrast  Result Date: 04/02/2018 CLINICAL DATA:  Altered mental status EXAM: CT HEAD WITHOUT CONTRAST TECHNIQUE: Contiguous axial images were obtained from the base of the skull through the vertex without intravenous contrast. COMPARISON:  None. FINDINGS: Brain: No evidence of acute infarction, hemorrhage, hydrocephalus, extra-axial collection or mass lesion/mass effect. Vascular: No hyperdense vessel or unexpected calcification. Skull: No osseous abnormality. Sinuses/Orbits: Visualized paranasal sinuses are clear. Visualized mastoid sinuses are clear. Visualized orbits demonstrate no focal abnormality. Other: None IMPRESSION: No acute intracranial pathology. Electronically Signed  By: Elige Ko   On: 04/02/2018 16:01   Mr Brain Wo Contrast (neuro Protocol) Result Date: 04/02/2018 CLINICAL DATA:  27 y/o  M; altered mental status, unclear cause. EXAM: MRI HEAD WITHOUT CONTRAST TECHNIQUE: Multiplanar, multiecho pulse sequences of the brain and surrounding structures  were obtained without intravenous contrast. COMPARISON:  04/02/2018 CT head FINDINGS: Brain: No acute infarction, hemorrhage, hydrocephalus, extra-axial collection or mass lesion. No significant signal or structural abnormality of the brain. Vascular: Normal flow voids. Skull and upper cervical spine: Normal marrow signal. Sinuses/Orbits: Negative. Other: None. IMPRESSION: Normal MRI of the head. Electronically Signed   By: Mitzi Hansen M.D.   On: 04/02/2018 19:13   Results for JOSEFF, LUCKMAN (MRN [id number]) as of 04/02/2018 16:24  Ref. Range 10/17/2015 21:52 05/08/2017 08:42 04/02/2018 15:04  Total Bilirubin Latest Ref Range: 0.3 - 1.2 mg/dL 2.0 (H) 2.1 (H) 2.8 (H)    1600:  ED RN brought concern to me: while pt was at CT scan, family presented a urine to her stating it was pt's. ED RN concerned, as pt had stated he could not give urine when she asked and now this urine was presented to her while pt was in CT scan. ED RN stated she discarded this urine and will obtain another.   1800:  UDS +benzos; none on pt's meds list. [place] PMP Database accessed: 2 prescriptions of hydrocodone/APAP in 04/2017 only.  Pt continues with stable VS, intermittently lethargic. T/C returned from [facility] Dr. Nelva Bush, case discussed, including:  HPI, pertinent PM/SHx, VS/PE, dx testing, ED course and treatment:  Agreeable to consult, agrees with ED workup (and reassuring CT-H), likely AMS d/t benzos and should clear by tomorrow, requests to obtain MRI brain, +/- EEG (does not feel strongly regarding emergent need for this given benzos on UDS), admit to medicine service for observation overnight and obtain psych consult tomorrow. (See separate note.)  1930:  MRI reassuring. Pt sitting up on stretcher, appears groggy but awake, slow speech. Dx and testing d/w pt and family.  Questions answered.  Verb understanding, agreeable to admit. T/C returned from [facility] Dr. Antionette Char, case discussed, including:  HPI, pertinent PM/SHx,  VS/PE, dx testing, ED course and treatment:  Agreeable to admit.       Final Clinical Impressions(s) / ED Diagnoses   Final diagnoses:  None    ED Discharge Orders    None       Samuel Jester, DO 04/05/18 1730

## 2018-04-02 NOTE — ED Triage Notes (Signed)
Pt outside smoking. Pt called to come in for triage. Pt continued to stay outside smoking. Will call again in a few minutes.

## 2018-04-02 NOTE — H&P (Signed)
History and Physical    Jason Grateshillip Capili ZOX:[id number] DOB: 03/05/1991 DOA: 04/02/2018  PCP: Patient, No Pcp Per   Patient coming from: Home  Chief Complaint: Confusion, lethargy, unsteady gait   HPI: Jason Peters is a 27 y.o. male with medical history significant for tobacco abuse and nephrolithiasis, presenting to the emergency department for evaluation of confusion, lethargy, and unsteady gait. Patient was noted by his wife to be confused last night, very forgetful and possibly slurring his words. He seemed improved this morning and went to work, but EMS was called out after coworkers became concerned, reporting that the patient was confused, lethargic, and with an unsteady gait. Patient denies headache, change in vision or hearing, or focal numbness or weakness. He denies any recent fall or trauma and denies recent alcohol or illicit drug use. Family states that he has never been this way previously. Patient denies any fevers, chills, or recent illness.   ED Course: Upon arrival to the ED, patient is found to be afebrile, saturating well on room air, and with vitals otherwise normal. EKG features a sinus rhythm with T-wave inversions in leads 3 and aVF. Noncontrast head CT is negative for acute intracranial abnormality, chest x-ray is negative for acute pulmonary disease, and MRI brain was a normal study. Chemistry panel is notable for a chronic hyperbilirubinemia and CBC is unremarkable. UDS is positive for benzodiazepines. ABG is normal, ethanol level undetectable, and acetaminophen and salicylate levels also undetectable. Neurology was consulted by the ED physician and recommended the MRI brain which is normal, and also advised for a medical observation with further evaluation with B12, ammonia and fasting lipid panel. Patient remains hemodynamically stable, in no apparent respiratory distress, and continues to have waxing and waning confusion. He will be observed for ongoing evaluation and  management.  Review of Systems:  All other systems reviewed and apart from HPI, are negative.  Past Medical History:  Diagnosis Date  . Kidney stone     Past Surgical History:  Procedure Laterality Date  . HERNIA REPAIR    . LAPAROSCOPIC APPENDECTOMY N/A 10/18/2015   Procedure: APPENDECTOMY LAPAROSCOPIC;  Surgeon: Franky MachoMark Jenkins, MD;  Location: [facility];  Service: General;  Laterality: N/A;     reports that he has been smoking cigarettes.  He has a 2.00 pack-year smoking history. His smokeless tobacco use includes chew. He reports that he does not drink alcohol or use drugs.  Allergies  Allergen Reactions  . Sulfa Antibiotics Rash    History reviewed. No pertinent family history.   Prior to Admission medications   Not on File    Physical Exam: Vitals:   04/02/18 1600 04/02/18 1630 04/02/18 1755 04/02/18 1807  BP: 126/77 115/88 138/81 (!) 137/96  Pulse: (!) 54  88   Resp: 20 12 12    Temp:      TempSrc:      SpO2: 100%  100% 99%  Weight:      Height:          Constitutional: NAD, calm  Eyes: PERTLA, lids and conjunctivae normal ENMT: Mucous membranes are moist. Posterior pharynx clear of any exudate or lesions.   Neck: normal, supple, no masses, no thyromegaly Respiratory: clear to auscultation bilaterally, no wheezing, no crackles. Normal respiratory effort.   Cardiovascular: S1 & S2 heard, regular rate and rhythm. No extremity edema.   Abdomen: No distension, no tenderness, soft. Bowel sounds normal.  Musculoskeletal: no clubbing / cyanosis. No joint deformity upper and lower extremities.  Skin: no significant rashes, lesions, ulcers. Warm, dry, well-perfused. Neurologic: CN 2-12 grossly intact. Sensation intact, patellar DTR normal. Strength 5/5 in all 4 limbs.  Psychiatric: Alert and oriented x 3. Calm, cooperative.     Labs on Admission: I have personally reviewed following labs and imaging studies  CBC: Recent Labs  Lab 04/02/18 1507  WBC 5.6    NEUTROABS 3.4  HGB 15.1  HCT 41.4  MCV 83.8  PLT 209   Basic Metabolic Panel: Recent Labs  Lab 04/02/18 1504  NA 139  K 3.5  CL 103  CO2 28  GLUCOSE 90  BUN 8  CREATININE 0.93  CALCIUM 9.6   GFR: Estimated Creatinine Clearance: 141.2 mL/min (by C-G formula based on SCr of 0.93 mg/dL). Liver Function Tests: Recent Labs  Lab 04/02/18 1504  AST 15  ALT 11  ALKPHOS 52  BILITOT 2.8*  PROT 7.6  ALBUMIN 5.0   Recent Labs  Lab 04/02/18 1507  LIPASE 22   No results for input(s): AMMONIA in the last 168 hours. Coagulation Profile: No results for input(s): INR, PROTIME in the last 168 hours. Cardiac Enzymes: Recent Labs  Lab 04/02/18 1513  TROPONINI <0.03   BNP (last 3 results) No results for input(s): PROBNP in the last 8760 hours. HbA1C: No results for input(s): HGBA1C in the last 72 hours. CBG: Recent Labs  Lab 04/02/18 1500  GLUCAP 93   Lipid Profile: No results for input(s): CHOL, HDL, LDLCALC, TRIG, CHOLHDL, LDLDIRECT in the last 72 hours. Thyroid Function Tests: No results for input(s): TSH, T4TOTAL, FREET4, T3FREE, THYROIDAB in the last 72 hours. Anemia Panel: No results for input(s): VITAMINB12, FOLATE, FERRITIN, TIBC, IRON, RETICCTPCT in the last 72 hours. Urine analysis:    Component Value Date/Time   COLORURINE STRAW (A) 04/02/2018 1506   APPEARANCEUR CLEAR 04/02/2018 1506   LABSPEC 1.006 04/02/2018 1506   PHURINE 8.0 04/02/2018 1506   GLUCOSEU NEGATIVE 04/02/2018 1506   HGBUR NEGATIVE 04/02/2018 1506   BILIRUBINUR NEGATIVE 04/02/2018 1506   KETONESUR NEGATIVE 04/02/2018 1506   PROTEINUR NEGATIVE 04/02/2018 1506   NITRITE NEGATIVE 04/02/2018 1506   LEUKOCYTESUR NEGATIVE 04/02/2018 1506   Sepsis Labs: @LABRCNTIP (procalcitonin:4,lacticidven:4) )No results found for this or any previous visit (from the past 240 hour(s)).   Radiological Exams on Admission: Dg Chest 2 View  Result Date: 04/02/2018 CLINICAL DATA:  27 year old male with  a history of lethargy and slurred speech EXAM: CHEST - 2 VIEW COMPARISON:  None. FINDINGS: The heart size and mediastinal contours are within normal limits. Both lungs are clear. The visualized skeletal structures are unremarkable. IMPRESSION: Negative for acute cardiopulmonary disease Electronically Signed   By: Gilmer Mor D.O.   On: 04/02/2018 15:37   Ct Head Wo Contrast  Result Date: 04/02/2018 CLINICAL DATA:  Altered mental status EXAM: CT HEAD WITHOUT CONTRAST TECHNIQUE: Contiguous axial images were obtained from the base of the skull through the vertex without intravenous contrast. COMPARISON:  None. FINDINGS: Brain: No evidence of acute infarction, hemorrhage, hydrocephalus, extra-axial collection or mass lesion/mass effect. Vascular: No hyperdense vessel or unexpected calcification. Skull: No osseous abnormality. Sinuses/Orbits: Visualized paranasal sinuses are clear. Visualized mastoid sinuses are clear. Visualized orbits demonstrate no focal abnormality. Other: None IMPRESSION: No acute intracranial pathology. Electronically Signed   By: Elige Ko   On: 04/02/2018 16:01   Mr Brain Wo Contrast (neuro Protocol)  Result Date: 04/02/2018 CLINICAL DATA:  27 y/o  M; altered mental status, unclear cause. EXAM: MRI HEAD WITHOUT CONTRAST TECHNIQUE:  Multiplanar, multiecho pulse sequences of the brain and surrounding structures were obtained without intravenous contrast. COMPARISON:  04/02/2018 CT head FINDINGS: Brain: No acute infarction, hemorrhage, hydrocephalus, extra-axial collection or mass lesion. No significant signal or structural abnormality of the brain. Vascular: Normal flow voids. Skull and upper cervical spine: Normal marrow signal. Sinuses/Orbits: Negative. Other: None. IMPRESSION: Normal MRI of the head. Electronically Signed   By: Mitzi Hansen M.D.   On: 04/02/2018 19:13    EKG: Independently reviewed. Sinus rhythm, T-wave inversions in III and aVF.   Assessment/Plan    1. Acute encephalopathy  - Waxing and waning confusion, lethargy, and unsteady gait reported  - UDS positive for benzodiazepines, does not appear to be prescribed  - Extensive ED workup otherwise normal, including MRI brain  - Teleneurology advised observation, check TSH, B12, and ammonia levels, fasting lipid panel, ASA, and seizure precautions   2. Tobacco abuse  - Counseled regarding cessation but not ready to quit  - Nicotine patch provided    DVT prophylaxis: Lovenox  Code Status: Full  Family Communication: Wife updated at bedside Consults called: Tele-neurology  Admission status: Observation     Briscoe Deutscher, MD [facility] Pager [phone]  If 7PM-7AM, please contact night-coverage [email or web address] Password [facility]  04/02/2018, 7:39 PM

## 2018-04-02 NOTE — Consult Note (Signed)
    Date:04/02/18 [place]  [facility] Tele[facility]urology Consult Services  Impression: AMS/disorientation - diff dx includes embolic shower, seizure and encephalopathy (metabolic/toxic/infx - bzd on utox) and primary psychiatric d/o  Recommendations:  asa if no contraindications tsh, b12, nh3, fasting lipid panel mri brain w/o contrast no aeds for now seizure/fall precautions Avoid sedative agents consider EEG r/o epileptiform discharges consider inpt neurology consult as per IM d/w ED attg ---------------------------------------------------------------------  CC: ams  History of Present Illness:  27yo M w no significant past medical hx that was well till 7:30pm last night when wife after returning from work found him confused intermittently and looking pale.  He worked w/o diff earlier in the day.  He denies etoh or drug use.  No prior similar episodes.  Sxs improved and he convinced wife to stay to sleep but sxs recurred today after going to work.  Wife endorses that he was making a call and entering his debit card number and that he did not recall that he was pregnant (had been for 15w).  He denies hallucinations (visual or auditory), SI or HI.   Diagnostic Testing: hct  NAF Vital Signs:    Exam:  Mental Status:  disheveled drowsy, alert, oriented to person and place and month and year but not date able to spell world backwards recall 3/3 w/o cues Naming: Intact Repetition: Intact   Speech: fluent  Cranial Nerves:  no dysarthria Extraocular movements: Intact in all cardinal gaze Ptosis: Absent Visual fields: Intact to finger counting Facial sensation: Intact to pin and light touch Facial movements: Intact and symmetric    Motor Exam:  No drift   Tremor/Abnormal Movements:  Resting tremor: Absent Intention tremor: Absent Postural tremor: Absent  Sensory Exam:   Light touch: Intact     Coordination:   Finger to nose: Intact  nihss 1 (drowsy)  Medical  Decision Making:  - Extensive number of diagnosis or management options are considered above.   - Extensive amount of complex data reviewed.   - High risk of complication and/or morbidity or mortality are associated with differential diagnostic considerations above.  - There may be uncertain outcome and increased probability of prolonged functional impairment or high probability of severe prolonged functional impairment associated with some of these differential diagnosis.   Medical Data Reviewed:  1.Data reviewed include clinical labs, radiology,  Medical Tests;   2.Tests results discussed w/performing or interpreting physician;   3.Obtaining/reviewing old medical records;  4.Obtaining case history from another source;  5.Independent review of image, tracing or specimen.    Patient was informed the Neurology Consult would happen via TeleHealth consult by way of interactive audio and video telecommunications and consented to receiving care in this manner.

## 2018-04-03 DIAGNOSIS — G9340 Encephalopathy, unspecified: Secondary | ICD-10-CM | POA: Diagnosis not present

## 2018-04-03 DIAGNOSIS — Z720 Tobacco use: Secondary | ICD-10-CM | POA: Diagnosis not present

## 2018-04-03 DIAGNOSIS — F1310 Sedative, hypnotic or anxiolytic abuse, uncomplicated: Secondary | ICD-10-CM | POA: Diagnosis not present

## 2018-04-03 LAB — COMPREHENSIVE METABOLIC PANEL
ALT: 10 U/L (ref 0–44)
AST: 11 U/L — ABNORMAL LOW (ref 15–41)
Albumin: 4.1 g/dL (ref 3.5–5.0)
Alkaline Phosphatase: 44 U/L (ref 38–126)
Anion gap: 6 (ref 5–15)
BUN: 9 mg/dL (ref 6–20)
CO2: 27 mmol/L (ref 22–32)
Calcium: 8.8 mg/dL — ABNORMAL LOW (ref 8.9–10.3)
Chloride: 108 mmol/L (ref 98–111)
Creatinine, Ser: 0.93 mg/dL (ref 0.61–1.24)
GFR calc Af Amer: >60 mL/min (ref >60)
GFR calc non Af Amer: >60 mL/min (ref >60)
Glucose, Bld: 100 mg/dL — ABNORMAL HIGH (ref 70–99)
Potassium: 4.1 mmol/L (ref 3.5–5.1)
Sodium: 141 mmol/L (ref 135–145)
Total Bilirubin: 2.1 mg/dL — ABNORMAL HIGH (ref 0.3–1.2)
Total Protein: 6.5 g/dL (ref 6.5–8.1)

## 2018-04-03 LAB — LIPID PANEL
Cholesterol: 140 mg/dL (ref 0–200)
HDL: 35 mg/dL — ABNORMAL LOW (ref >40)
LDL Cholesterol: 82 mg/dL (ref 0–99)
Total CHOL/HDL Ratio: 4 RATIO
Triglycerides: 114 mg/dL (ref <150)
VLDL: 23 mg/dL (ref 0–40)

## 2018-04-03 LAB — VITAMIN B12: Vitamin B-12: 190 pg/mL (ref 180–914)

## 2018-04-03 LAB — MRSA PCR SCREENING: MRSA by PCR: NEGATIVE

## 2018-04-03 NOTE — Discharge Summary (Signed)
Physician Discharge Summary  Jason Peters ZOX:[id number] DOB: [birth year] DOA: 04/02/2018  PCP: Patient, No Pcp Per  Admit date: 04/02/2018 Discharge date: 04/03/2018  Time spent: 45 minutes  Recommendations for Outpatient Follow-up:  -Will be discharged home today. -Advised follow up with PCP in 2 weeks.   Discharge Diagnoses:  Principal Problem:   Acute encephalopathy Active Problems:   Tobacco abuse   Discharge Condition: Stable and improved  Filed Weights   04/02/18 1456 04/02/18 2151  Weight: 95.3 kg (210 lb) 85 kg (187 lb 6.3 oz)    History of present illness:  As per Dr. Antionette Char on 7/11: Jason Peters is a 27 y.o. male with medical history significant for tobacco abuse and nephrolithiasis, presenting to the emergency department for evaluation of confusion, lethargy, and unsteady gait. Patient was noted by his wife to be confused last night, very forgetful and possibly slurring his words. He seemed improved this morning and went to work, but EMS was called out after coworkers became concerned, reporting that the patient was confused, lethargic, and with an unsteady gait. Patient denies headache, change in vision or hearing, or focal numbness or weakness. He denies any recent fall or trauma and denies recent alcohol or illicit drug use. Family states that he has never been this way previously. Patient denies any fevers, chills, or recent illness.   ED Course: Upon arrival to the ED, patient is found to be afebrile, saturating well on room air, and with vitals otherwise normal. EKG features a sinus rhythm with T-wave inversions in leads 3 and aVF. Noncontrast head CT is negative for acute intracranial abnormality, chest x-ray is negative for acute pulmonary disease, and MRI brain was a normal study. Chemistry panel is notable for a chronic hyperbilirubinemia and CBC is unremarkable. UDS is positive for benzodiazepines. ABG is normal, ethanol level undetectable, and  acetaminophen and salicylate levels also undetectable. Neurology was consulted by the ED physician and recommended the MRI brain which is normal, and also advised for a medical observation with further evaluation with B12, ammonia and fasting lipid panel. Patient remains hemodynamically stable, in no apparent respiratory distress, and continues to have waxing and waning confusion. He will be observed for ongoing evaluation and management.    Hospital Course:   Acute encephalopathy -Per wife has returned to  baseline. -Extensive work-up is all been negative including CT, MRI, electrolytes, no active seizure activity. -Patient was found to have a UDS positive for benzodiazepines which he is not prescribed.  He seems "perplexed" as to how this could have gotten in his system but is adamant that he has never taken benzodiazepines. -Given rapid resolution of symptoms and positive UDS I believe accidental/intentional benzodiazepine ingestion to be the etiology.  No further follow-up recommended at this time.  Tobacco abuse -I have discussed tobacco cessation with the patient.  I have counseled the patient regarding the negative impacts of continued tobacco use including but not limited to lung cancer, COPD, and cardiovascular disease.  I have discussed alternatives to tobacco and modalities that may help facilitate tobacco cessation including but not limited to biofeedback, hypnosis, and medications.  Total time spent with tobacco counseling was 4 minutes   Procedures:  As above   Consultations:  None  Discharge Instructions  Discharge Instructions    Increase activity slowly   Complete by:  As directed      Allergies as of 04/03/2018      Reactions   Sulfa Antibiotics Rash  Medication List    You have not been prescribed any medications.    Allergies  Allergen Reactions  . Sulfa Antibiotics Rash   Follow-up Information    your regular physician. Schedule an appointment as  soon as possible for a visit in 2 week(s).            The results of significant diagnostics from this hospitalization (including imaging, microbiology, ancillary and laboratory) are listed below for reference.    Significant Diagnostic Studies: Dg Chest 2 View  Result Date: 04/02/2018 CLINICAL DATA:  27 year old male with a history of lethargy and slurred speech EXAM: CHEST - 2 VIEW COMPARISON:  None. FINDINGS: The heart size and mediastinal contours are within normal limits. Both lungs are clear. The visualized skeletal structures are unremarkable. IMPRESSION: Negative for acute cardiopulmonary disease Electronically Signed   By: Gilmer Mor D.O.   On: 04/02/2018 15:37   Ct Head Wo Contrast  Result Date: 04/02/2018 CLINICAL DATA:  Altered mental status EXAM: CT HEAD WITHOUT CONTRAST TECHNIQUE: Contiguous axial images were obtained from the base of the skull through the vertex without intravenous contrast. COMPARISON:  None. FINDINGS: Brain: No evidence of acute infarction, hemorrhage, hydrocephalus, extra-axial collection or mass lesion/mass effect. Vascular: No hyperdense vessel or unexpected calcification. Skull: No osseous abnormality. Sinuses/Orbits: Visualized paranasal sinuses are clear. Visualized mastoid sinuses are clear. Visualized orbits demonstrate no focal abnormality. Other: None IMPRESSION: No acute intracranial pathology. Electronically Signed   By: Elige Ko   On: 04/02/2018 16:01   Mr Brain Wo Contrast (neuro Protocol)  Result Date: 04/02/2018 CLINICAL DATA:  27 y/o  M; altered mental status, unclear cause. EXAM: MRI HEAD WITHOUT CONTRAST TECHNIQUE: Multiplanar, multiecho pulse sequences of the brain and surrounding structures were obtained without intravenous contrast. COMPARISON:  04/02/2018 CT head FINDINGS: Brain: No acute infarction, hemorrhage, hydrocephalus, extra-axial collection or mass lesion. No significant signal or structural abnormality of the brain.  Vascular: Normal flow voids. Skull and upper cervical spine: Normal marrow signal. Sinuses/Orbits: Negative. Other: None. IMPRESSION: Normal MRI of the head. Electronically Signed   By: Mitzi Hansen M.D.   On: 04/02/2018 19:13    Microbiology: Recent Results (from the past 240 hour(s))  MRSA PCR Screening     Status: None   Collection Time: 04/02/18 11:29 PM  Result Value Ref Range Status   MRSA by PCR NEGATIVE NEGATIVE Final    Comment:        The GeneXpert MRSA Assay (FDA approved for NASAL specimens only), is one component of a comprehensive MRSA colonization surveillance program. It is not intended to diagnose MRSA infection nor to guide or monitor treatment for MRSA infections. Performed at [facility], [address] [place], [place] [address]      Labs: Basic Metabolic Panel: Recent Labs  Lab 04/02/18 1504 04/03/18 0458  NA 139 141  K 3.5 4.1  CL 103 108  CO2 28 27  GLUCOSE 90 100*  BUN 8 9  CREATININE 0.93 0.93  CALCIUM 9.6 8.8*   Liver Function Tests: Recent Labs  Lab 04/02/18 1504 04/02/18 2004 04/03/18 0458  AST 15  --  11*  ALT 11  --  10  ALKPHOS 52  --  44  BILITOT 2.8* 2.1* 2.1*  PROT 7.6  --  6.5  ALBUMIN 5.0  --  4.1   Recent Labs  Lab 04/02/18 1507  LIPASE 22   Recent Labs  Lab 04/02/18 2004  AMMONIA 17   CBC: Recent Labs  Lab 04/02/18 1507  WBC 5.6  NEUTROABS 3.4  HGB 15.1  HCT 41.4  MCV 83.8  PLT 209   Cardiac Enzymes: Recent Labs  Lab 04/02/18 1513  TROPONINI <0.03   BNP: BNP (last 3 results) No results for input(s): BNP in the last 8760 hours.  ProBNP (last 3 results) No results for input(s): PROBNP in the last 8760 hours.  CBG: Recent Labs  Lab 04/02/18 1500  GLUCAP 93       Signed:  Chaya JanEstela Hernandez Acosta  [facility] Pager: [phone] 04/03/2018, 12:44 PM

## 2018-04-03 NOTE — Progress Notes (Signed)
Per MT, patient has had a few episodes of ST depression for 5-6 beats. This RN was in patient's room. Patient asymptomatic. Bodenheimer, NP notified. Will continue to monitor.

## 2018-04-03 NOTE — Care Management (Signed)
Patient left before receiving a PCP list.

## 2018-04-03 NOTE — Progress Notes (Signed)
Discharge instructions gone over with patient, verbalized understanding. IV removed, patient tolerated procedure well. Work note given to patient.  

## 2018-04-03 NOTE — Clinical Social Work Note (Signed)
Patient refereed to CSW due to not having a PCP. This is a function of CM. LCSW notified CM who will assess needs.   Jason Peters, Juleen ChinaHeather D, LCSW

## 2018-04-04 LAB — HIV ANTIBODY (ROUTINE TESTING W REFLEX): HIV Screen 4th Generation wRfx: NONREACTIVE

## 2018-04-06 NOTE — ED Notes (Signed)
Pt's wife called requesting and extended work note.  Advised to follow up with pcp regarding this.

## 2018-05-30 ENCOUNTER — Emergency Department (HOSPITAL_COMMUNITY)
Admission: EM | Admit: 2018-05-30 | Discharge: 2018-05-30 | Disposition: A | Discharge status: Home / Self Care | Payer: BLUE CROSS/BLUE SHIELD | Attending: Emergency Medicine | Admitting: Emergency Medicine

## 2018-05-30 ENCOUNTER — Encounter (HOSPITAL_COMMUNITY): Payer: Self-pay | Admitting: Emergency Medicine

## 2018-05-30 ENCOUNTER — Other Ambulatory Visit: Payer: Self-pay

## 2018-05-30 DIAGNOSIS — R51 Headache: Secondary | ICD-10-CM | POA: Insufficient documentation

## 2018-05-30 DIAGNOSIS — R519 Headache, unspecified: Secondary | ICD-10-CM

## 2018-05-30 MED ORDER — KETOROLAC TROMETHAMINE 30 MG/ML IJ SOLN
15.0000 mg | Freq: Once | INTRAMUSCULAR | Status: AC
  Administered 2018-05-30: 15 mg via INTRAVENOUS
  Filled 2018-05-30: qty 1

## 2018-05-30 MED ORDER — PROMETHAZINE HCL 25 MG/ML IJ SOLN
12.5000 mg | Freq: Once | INTRAMUSCULAR | Status: AC
  Administered 2018-05-30: 12.5 mg via INTRAVENOUS
  Filled 2018-05-30: qty 1

## 2018-05-30 NOTE — ED Triage Notes (Signed)
Pt here from work accompanied by co-worker who states pt was noticed to have unsteady gait at work and was found sitting in floor. Pt barely verbal in triage. Per co-worker, pt had these same symptoms not too long ago and was diagnosed with something on his brain. Pt unable to give [place] any info at this time. Co-worker states pt was c/o severe headache.

## 2018-05-30 NOTE — ED Notes (Signed)
Pt ambulated off unit without discharge papers

## 2018-06-17 NOTE — ED Provider Notes (Signed)
[facility] EMERGENCY DEPARTMENT Provider Note   CSN: [id number] Arrival date & time: 05/30/18  2036     History   Chief Complaint Chief Complaint  Patient presents with  . Headache    HPI Jason Peters is a 27 y.o. male.  HPI Patient presents with altered mental status.  Reportedly is at work began to have a headache.  Dull over his head.  Reportedly sat on the floor.  Has had these episodes before.  Has had an extensive work-up.  States this 1 was similar to when he had previously.  Feeling somewhat better now but still has a dull headache.  No trauma.  No fevers. Past Medical History:  Diagnosis Date  . Kidney stone     Patient Active Problem List   Diagnosis Date Noted  . Acute encephalopathy 04/02/2018  . Tobacco abuse 04/02/2018    Past Surgical History:  Procedure Laterality Date  . HERNIA REPAIR    . LAPAROSCOPIC APPENDECTOMY N/A 10/18/2015   Procedure: APPENDECTOMY LAPAROSCOPIC;  Surgeon: Franky Macho, MD;  Location: [facility];  Service: General;  Laterality: N/A;        Home Medications    Prior to Admission medications   Medication Sig Start Date End Date Taking? Authorizing Provider  acetaminophen (TYLENOL) 325 MG tablet Take 1,300 mg by mouth once as needed for headache.   Yes [provider]  OMEPRAZOLE PO Take 1 capsule by mouth daily.   Yes [provider]    Family History No family history on file.  Social History Social History   Tobacco Use  . Smoking status: Current Every Day Smoker    Packs/day: 1.00    Years: 2.00    Pack years: 2.00    Types: Cigarettes  . Smokeless tobacco: Current User    Types: Chew  Substance Use Topics  . Alcohol use: No  . Drug use: No     Allergies   Sulfa antibiotics   Review of Systems Review of Systems  Constitutional: Negative for fever.  HENT: Negative for congestion.   Respiratory: Negative for shortness of breath.   Cardiovascular: Negative for chest pain.    Gastrointestinal: Negative for abdominal pain.  Genitourinary: Negative for frequency.  Musculoskeletal: Negative for back pain.  Neurological: Positive for headaches. Negative for weakness.  Psychiatric/Behavioral: Positive for confusion.     Physical Exam Updated Vital Signs BP 135/78   Pulse 73   Temp (!) 97.5 F (36.4 C) (Oral)   Resp 18   Wt 81.6 kg   SpO2 100%   BMI 23.11 kg/m   Physical Exam  Constitutional: He is oriented to person, place, and time. He appears well-developed.  HENT:  Head: Normocephalic.  Eyes: Pupils are equal, round, and reactive to light.  Neck: Neck supple.  Cardiovascular: Normal rate.  Abdominal: Soft.  Neurological: He is alert and oriented to person, place, and time.  Skin: Skin is warm.     ED Treatments / Results  Labs (all labs ordered are listed, but only abnormal results are displayed) Labs Reviewed - No data to display  EKG None  Radiology No results found.  Procedures Procedures (including critical care time)  Medications Ordered in ED Medications  ketorolac (TORADOL) 30 MG/ML injection 15 mg (15 mg Intravenous Given 05/30/18 2154)  promethazine (PHENERGAN) injection 12.5 mg (12.5 mg Intravenous Given 05/30/18 2154)     Initial Impression / Assessment and Plan / ED Course  I have reviewed the triage vital signs and  the nursing notes.  Pertinent labs & imaging results that were available during my care of the patient were reviewed by me and considered in my medical decision making (see chart for details).     Patient with mental status change.  History of same.  Has had benzos in his urine before and had not been prescribed.  Recent extensive work-up.  Given Phenergan and Toradol here to treat the headache.  Patient back at baseline.  Final Clinical Impressions(s) / ED Diagnoses   Final diagnoses:  Nonintractable headache, unspecified chronicity pattern, unspecified headache type    ED Discharge Orders    None        Benjiman Core, MD 06/17/18 [id number]

## 2018-09-23 HISTORY — PX: FRACTURE SURGERY: SHX138

## 2018-10-20 ENCOUNTER — Other Ambulatory Visit (HOSPITAL_COMMUNITY): Payer: Self-pay | Admitting: Internal Medicine

## 2018-10-20 ENCOUNTER — Ambulatory Visit (HOSPITAL_COMMUNITY): Admission: RE | Admit: 2018-10-20 | Payer: BLUE CROSS/BLUE SHIELD | Source: Ambulatory Visit

## 2018-10-20 ENCOUNTER — Encounter (HOSPITAL_COMMUNITY): Payer: Self-pay

## 2018-10-20 DIAGNOSIS — N50812 Left testicular pain: Secondary | ICD-10-CM

## 2018-11-19 ENCOUNTER — Encounter (HOSPITAL_COMMUNITY): Payer: Self-pay | Admitting: Emergency Medicine

## 2018-11-19 ENCOUNTER — Other Ambulatory Visit: Payer: Self-pay

## 2018-11-19 ENCOUNTER — Emergency Department (HOSPITAL_COMMUNITY)
Admission: EM | Admit: 2018-11-19 | Discharge: 2018-11-19 | Disposition: A | Discharge status: Home / Self Care | Payer: BLUE CROSS/BLUE SHIELD | Attending: Emergency Medicine | Admitting: Emergency Medicine

## 2018-11-19 DIAGNOSIS — Y939 Activity, unspecified: Secondary | ICD-10-CM | POA: Diagnosis not present

## 2018-11-19 DIAGNOSIS — S0181XA Laceration without foreign body of other part of head, initial encounter: Secondary | ICD-10-CM | POA: Diagnosis not present

## 2018-11-19 DIAGNOSIS — Y999 Unspecified external cause status: Secondary | ICD-10-CM | POA: Diagnosis not present

## 2018-11-19 DIAGNOSIS — Y929 Unspecified place or not applicable: Secondary | ICD-10-CM | POA: Diagnosis not present

## 2018-11-19 DIAGNOSIS — Z79899 Other long term (current) drug therapy: Secondary | ICD-10-CM | POA: Insufficient documentation

## 2018-11-19 DIAGNOSIS — S0993XA Unspecified injury of face, initial encounter: Secondary | ICD-10-CM | POA: Diagnosis present

## 2018-11-19 DIAGNOSIS — F17210 Nicotine dependence, cigarettes, uncomplicated: Secondary | ICD-10-CM | POA: Insufficient documentation

## 2018-11-19 DIAGNOSIS — Y9389 Activity, other specified: Secondary | ICD-10-CM | POA: Diagnosis not present

## 2018-11-19 DIAGNOSIS — W228XXA Striking against or struck by other objects, initial encounter: Secondary | ICD-10-CM | POA: Diagnosis not present

## 2018-11-19 MED ORDER — ACETAMINOPHEN 325 MG PO TABS
650.0000 mg | ORAL_TABLET | Freq: Once | ORAL | Status: AC
  Administered 2018-11-19: 650 mg via ORAL
  Filled 2018-11-19: qty 2

## 2018-11-19 NOTE — ED Notes (Signed)
Pt left before received receiving discharge instructions

## 2018-11-19 NOTE — ED Triage Notes (Signed)
Laceration above RT eye. Was working on truck and hit himself with a part by mistake. No active bleeding.  Denies any N/V. Does report headache.

## 2018-11-19 NOTE — ED Notes (Signed)
Small laceration to right forehead . No active bleeding Up to date on tetanus

## 2018-11-19 NOTE — ED Provider Notes (Signed)
[facility] EMERGENCY DEPARTMENT Provider Note   CSN: [id number] Arrival date & time: 11/19/18  1422    History   Chief Complaint Chief Complaint  Patient presents with  . Facial Laceration    HPI Jason Peters is a 28 y.o. male who presents to the ED for a laceration above the right eye. Patient reports working on a truck and hit himself with a piece of metal. No LOC, n/v or other problems.      HPI  Past Medical History:  Diagnosis Date  . Kidney stone     Patient Active Problem List   Diagnosis Date Noted  . Acute encephalopathy 04/02/2018  . Tobacco abuse 04/02/2018    Past Surgical History:  Procedure Laterality Date  . HERNIA REPAIR    . LAPAROSCOPIC APPENDECTOMY N/A 10/18/2015   Procedure: APPENDECTOMY LAPAROSCOPIC;  Surgeon: Franky Macho, MD;  Location: [facility];  Service: General;  Laterality: N/A;        Home Medications    Prior to Admission medications   Medication Sig Start Date End Date Taking? Authorizing Provider  acetaminophen (TYLENOL) 325 MG tablet Take 1,300 mg by mouth once as needed for headache.    [provider]  OMEPRAZOLE PO Take 1 capsule by mouth daily.    [provider]    Family History No family history on file.  Social History Social History   Tobacco Use  . Smoking status: Current Every Day Smoker    Packs/day: 1.00    Years: 2.00    Pack years: 2.00    Types: Cigarettes  . Smokeless tobacco: Current User    Types: Chew  Substance Use Topics  . Alcohol use: No  . Drug use: No     Allergies   Sulfa antibiotics   Review of Systems Review of Systems  Skin: Positive for wound.  Neurological: Headaches: mild.  All other systems reviewed and are negative.    Physical Exam Updated Vital Signs BP 132/68 (BP Location: Right Arm)   Pulse 77   Temp 97.8 F (36.6 C) (Oral)   Resp 16   Ht 6\' 2"  (1.88 m)   Wt 83.9 kg   SpO2 99%   BMI 23.75 kg/m   Physical Exam Vitals signs and  nursing note reviewed.  Constitutional:      General: He is not in acute distress.    Appearance: He is well-developed.  HENT:     Head: Contusion and laceration present.     Jaw: No trismus.      Comments: There is swelling to the forehead surrounding the wound.     Right Ear: Tympanic membrane normal.     Left Ear: Tympanic membrane normal.     Nose: Nose normal.     Mouth/Throat:     Mouth: Mucous membranes are moist.     Pharynx: Oropharynx is clear.  Eyes:     Extraocular Movements: Extraocular movements intact.     Conjunctiva/sclera: Conjunctivae normal.     Pupils: Pupils are equal, round, and reactive to light.  Neck:     Musculoskeletal: Normal range of motion and neck supple. No muscular tenderness.  Cardiovascular:     Rate and Rhythm: Normal rate.  Pulmonary:     Effort: Pulmonary effort is normal.  Musculoskeletal: Normal range of motion.  Skin:    General: Skin is warm and dry.  Neurological:     Mental Status: He is alert and oriented to person, place, and time.  Cranial Nerves: No cranial nerve deficit.     Motor: Motor function is intact.     Coordination: Romberg sign negative.     Gait: Gait normal.     Deep Tendon Reflexes:     Reflex Scores:      Bicep reflexes are 2+ on the right side and 2+ on the left side.      Brachioradialis reflexes are 2+ on the right side and 2+ on the left side.      Patellar reflexes are 2+ on the right side and 2+ on the left side. Psychiatric:        Mood and Affect: Mood normal.        Thought Content: Thought content normal.      ED Treatments / Results  Labs (all labs ordered are listed, but only abnormal results are displayed) Labs Reviewed - No data to display  EKG None  Radiology No results found.  Procedures .Marland KitchenLaceration Repair Date/Time: 11/19/2018 4:08 PM Performed by: Janne Napoleon, NP Authorized by: Janne Napoleon, NP   Consent:    Consent obtained:  Verbal   Consent given by:  Patient    Risks discussed:  Poor cosmetic result   Alternatives discussed:  No treatment Anesthesia (see MAR for exact dosages):    Anesthesia method:  None Laceration details:    Location:  Face   Face location:  Forehead   Length (cm):  1 Repair type:    Repair type:  Simple Exploration:    Hemostasis achieved with:  Direct pressure   Wound exploration: entire depth of wound probed and visualized     Contaminated: no   Treatment:    Area cleansed with:  Saline   Amount of cleaning:  Standard Skin repair:    Repair method:  Tissue adhesive Approximation:    Approximation:  Close Post-procedure details:    Dressing:  Open (no dressing)   Patient tolerance of procedure:  Tolerated well, no immediate complications Comments:     Patient is up to date on tetanus   (including critical care time)  Medications Ordered in ED Medications  acetaminophen (TYLENOL) tablet 650 mg (650 mg Oral Given 11/19/18 1524)     Initial Impression / Assessment and Plan / ED Course  I have reviewed the triage vital signs and the nursing notes. 28 y.o. male who presents to the ED s/p injury to forehead while working on a truck. Patient drove himself to the ED. Patient appears stable for d/c without neuro deficits. dermabond to the laceration and return precautions discussed in detail with patient and family member. They agree with plan.   Final Clinical Impressions(s) / ED Diagnoses   Final diagnoses:  Facial laceration, initial encounter    ED Discharge Orders    None       Kerrie Buffalo [place], [place] 11/19/18 1622    Bethann Berkshire, MD 11/19/18 [phone]

## 2018-11-19 NOTE — Discharge Instructions (Addendum)
Do not use ointment on the wound until the adhesive dressing is completely off.

## 2018-11-19 NOTE — ED Notes (Signed)
Area cleaned and dry dressing applied

## 2018-12-08 ENCOUNTER — Emergency Department (HOSPITAL_COMMUNITY)
Admission: EM | Admit: 2018-12-08 | Discharge: 2018-12-08 | Disposition: A | Discharge status: Home / Self Care | Payer: BLUE CROSS/BLUE SHIELD | Attending: Emergency Medicine | Admitting: Emergency Medicine

## 2018-12-08 ENCOUNTER — Encounter (HOSPITAL_COMMUNITY): Payer: Self-pay | Admitting: Emergency Medicine

## 2018-12-08 ENCOUNTER — Other Ambulatory Visit: Payer: Self-pay

## 2018-12-08 ENCOUNTER — Emergency Department (HOSPITAL_COMMUNITY): Payer: BLUE CROSS/BLUE SHIELD

## 2018-12-08 DIAGNOSIS — Z79899 Other long term (current) drug therapy: Secondary | ICD-10-CM | POA: Insufficient documentation

## 2018-12-08 DIAGNOSIS — J209 Acute bronchitis, unspecified: Secondary | ICD-10-CM | POA: Diagnosis not present

## 2018-12-08 DIAGNOSIS — Z20828 Contact with and (suspected) exposure to other viral communicable diseases: Secondary | ICD-10-CM | POA: Diagnosis not present

## 2018-12-08 DIAGNOSIS — R05 Cough: Secondary | ICD-10-CM | POA: Diagnosis present

## 2018-12-08 DIAGNOSIS — F17210 Nicotine dependence, cigarettes, uncomplicated: Secondary | ICD-10-CM | POA: Insufficient documentation

## 2018-12-08 LAB — INFLUENZA PANEL BY PCR (TYPE A & B)
Influenza A By PCR: NEGATIVE
Influenza B By PCR: NEGATIVE

## 2018-12-08 MED ORDER — AZITHROMYCIN 250 MG PO TABS: 250.0000 mg | ORAL_TABLET | Freq: Every day | ORAL | 0 refills | Status: DC

## 2018-12-08 NOTE — ED Notes (Addendum)
Patient transported to X-ray 

## 2018-12-08 NOTE — ED Provider Notes (Signed)
[facility] EMERGENCY DEPARTMENT Provider Note   CSN: [id number] Arrival date & time: 12/08/18  1919    History   Chief Complaint Chief Complaint  Patient presents with  . Cough    HPI Jason Peters is a 28 y.o. male.  28 year old male smoker here with sore throat cough headache runny nose since Sunday.  He has chest pain when he coughs.  It is not productive of any sputum.  He is felt hot and cold but has not checked a temperature.  No nausea vomiting diarrhea urinary symptoms.  No rashes or swollen joints.  No recent travel or sick contacts.  He is tried nothing for it.  No hemoptysis.     The history is provided by the patient.  Cough  Cough characteristics:  Non-productive and harsh Severity:  Moderate Onset quality:  Gradual Timing:  Intermittent Progression:  Worsening Chronicity:  New Smoker: yes   Context: not sick contacts   Relieved by:  None tried Worsened by:  Activity Ineffective treatments:  None tried Associated symptoms: chest pain, headaches, rhinorrhea, shortness of breath and sore throat   Associated symptoms: no ear fullness, no ear pain, no eye discharge, no fever, no myalgias, no rash, no sinus congestion, no weight loss and no wheezing   Risk factors: no recent infection and no recent travel     Past Medical History:  Diagnosis Date  . Kidney stone     Patient Active Problem List   Diagnosis Date Noted  . Acute encephalopathy 04/02/2018  . Tobacco abuse 04/02/2018    Past Surgical History:  Procedure Laterality Date  . HERNIA REPAIR    . LAPAROSCOPIC APPENDECTOMY N/A 10/18/2015   Procedure: APPENDECTOMY LAPAROSCOPIC;  Surgeon: Franky Macho, MD;  Location: [facility];  Service: General;  Laterality: N/A;        Home Medications    Prior to Admission medications   Medication Sig Start Date End Date Taking? Authorizing Provider  acetaminophen (TYLENOL) 325 MG tablet Take 1,300 mg by mouth once as needed for headache.    [provider]  OMEPRAZOLE PO Take 1 capsule by mouth daily.    [provider]    Family History History reviewed. No pertinent family history.  Social History Social History   Tobacco Use  . Smoking status: Current Every Day Smoker    Packs/day: 1.00    Years: 2.00    Pack years: 2.00    Types: Cigarettes  . Smokeless tobacco: Current User    Types: Chew  Substance Use Topics  . Alcohol use: No  . Drug use: No     Allergies   Sulfa antibiotics   Review of Systems Review of Systems  Constitutional: Negative for fever and weight loss.  HENT: Positive for rhinorrhea and sore throat. Negative for ear pain.   Eyes: Negative for discharge.  Respiratory: Positive for cough and shortness of breath. Negative for wheezing.   Cardiovascular: Positive for chest pain.  Gastrointestinal: Negative for abdominal pain.  Genitourinary: Negative for dysuria.  Musculoskeletal: Negative for myalgias.  Skin: Negative for rash.  Neurological: Positive for headaches.     Physical Exam Updated Vital Signs BP 129/87 (BP Location: Right Arm)   Pulse 83   Temp 98.6 F (37 C) (Oral)   Resp 16   Ht 6\' 2"  (1.88 m)   Wt 83.9 kg   SpO2 98%   BMI 23.75 kg/m   Physical Exam Vitals signs and nursing note reviewed.  Constitutional:  Appearance: He is well-developed.  HENT:     Head: Normocephalic and atraumatic.     Right Ear: Tympanic membrane and ear canal normal.     Left Ear: Tympanic membrane and ear canal normal.     Nose: Nose normal.     Mouth/Throat:     Mouth: Mucous membranes are moist.     Pharynx: Oropharynx is clear. Posterior oropharyngeal erythema present. No oropharyngeal exudate.  Eyes:     Extraocular Movements: Extraocular movements intact.     Conjunctiva/sclera: Conjunctivae normal.     Pupils: Pupils are equal, round, and reactive to light.  Neck:     Musculoskeletal: Neck supple.  Cardiovascular:     Rate and Rhythm: Normal rate and regular  rhythm.     Heart sounds: No murmur.  Pulmonary:     Effort: Pulmonary effort is normal. No respiratory distress.     Breath sounds: Normal breath sounds.  Abdominal:     Palpations: Abdomen is soft.     Tenderness: There is no abdominal tenderness.  Musculoskeletal: Normal range of motion.     Right lower leg: No edema.     Left lower leg: No edema.  Lymphadenopathy:     Cervical: No cervical adenopathy.  Skin:    General: Skin is warm and dry.     Capillary Refill: Capillary refill takes less than 2 seconds.  Neurological:     General: No focal deficit present.     Mental Status: He is alert and oriented to person, place, and time.     Gait: Gait normal.      ED Treatments / Results  Labs (all labs ordered are listed, but only abnormal results are displayed) Labs Reviewed  NOVEL CORONAVIRUS, NAA (HOSPITAL ORDER, SEND-OUT TO REF LAB)  INFLUENZA PANEL BY PCR (TYPE A & B)    EKG None  Radiology Dg Chest 2 View  Result Date: 12/08/2018 CLINICAL DATA:  Initial evaluation for acute cough and congestion. EXAM: CHEST - 2 VIEW COMPARISON:  Prior radiograph from 04/02/2018. FINDINGS: The cardiac and mediastinal silhouettes are stable in size and contour, and remain within normal limits. The lungs are normally inflated. No airspace consolidation, pleural effusion, or pulmonary edema is identified. There is no pneumothorax. No acute osseous abnormality identified. IMPRESSION: No radiographic evidence for active cardiopulmonary disease. Electronically Signed   By: Rise Mu M.D.   On: 12/08/2018 20:08    Procedures Procedures (including critical care time)  Medications Ordered in ED Medications - No data to display   Initial Impression / Assessment and Plan / ED Course  I have reviewed the triage vital signs and the nursing notes.  Pertinent labs & imaging results that were available during my care of the patient were reviewed by me and considered in my medical  decision making (see chart for details).  Clinical Course as of Dec 08 948  Tue Dec 08, 2018  2121 Patient's flu and chest x-ray are unremarkable.  I put him in for [facility] testing although he has no high risk features.  He signed the form that he will self monitor and isolate at home until test results are back.  I will cover him with Zithromax for possible bronchitis.   [MB]  2122 Differential diagnosis includes bronchitis, flu, Covid, pneumonia,  other upper respiratory infection viral and bacterial.   [MB]    Clinical Course User Index [MB] Terrilee Files, MD        Final Clinical Impressions(s) /  ED Diagnoses   Final diagnoses:  Acute bronchitis, unspecified organism    ED Discharge Orders         Ordered    azithromycin (ZITHROMAX) 250 MG tablet  Daily     12/08/18 2049           Terrilee Files, MD 12/09/18 [phone]

## 2018-12-08 NOTE — ED Triage Notes (Signed)
Patient complaining of cough and congestion since Sunday.

## 2018-12-08 NOTE — Discharge Instructions (Addendum)
Your seen in the emergency department for 3 days of worsening cough runny nose sore throat.  Your flu test was negative and we are sending off the coronavirus test to the laboratory.  We are treating you with antibiotics for possible bronchitis.  He should drink plenty of fluids and get rest.  You should monitor your temperature and isolate at home until you are symptom-free.  The hospital will notify you if you have a positive test.  Please contact your doctor return if any worsening symptoms.

## 2018-12-17 LAB — NOVEL CORONAVIRUS, NAA (HOSP ORDER, SEND-OUT TO REF LAB; TAT 18-24 HRS): SARS-CoV-2, NAA: NOT DETECTED

## 2019-04-13 ENCOUNTER — Emergency Department (HOSPITAL_COMMUNITY): Payer: BC Managed Care – PPO

## 2019-04-13 ENCOUNTER — Emergency Department (HOSPITAL_COMMUNITY)
Admission: EM | Admit: 2019-04-13 | Discharge: 2019-04-13 | Disposition: A | Discharge status: Home / Self Care | Payer: BC Managed Care – PPO | Source: Home / Self Care | Attending: Emergency Medicine | Admitting: Emergency Medicine

## 2019-04-13 ENCOUNTER — Emergency Department (HOSPITAL_COMMUNITY)
Admission: EM | Admit: 2019-04-13 | Discharge: 2019-04-13 | Discharge status: Against Medical Advice | Payer: BC Managed Care – PPO | Attending: Emergency Medicine | Admitting: Emergency Medicine

## 2019-04-13 ENCOUNTER — Encounter (HOSPITAL_COMMUNITY): Payer: Self-pay

## 2019-04-13 ENCOUNTER — Encounter (HOSPITAL_COMMUNITY): Payer: Self-pay | Admitting: *Deleted

## 2019-04-13 ENCOUNTER — Other Ambulatory Visit: Payer: Self-pay

## 2019-04-13 DIAGNOSIS — F17210 Nicotine dependence, cigarettes, uncomplicated: Secondary | ICD-10-CM | POA: Insufficient documentation

## 2019-04-13 DIAGNOSIS — N201 Calculus of ureter: Secondary | ICD-10-CM | POA: Insufficient documentation

## 2019-04-13 DIAGNOSIS — R1031 Right lower quadrant pain: Secondary | ICD-10-CM | POA: Diagnosis present

## 2019-04-13 LAB — URINALYSIS, ROUTINE W REFLEX MICROSCOPIC
Bilirubin Urine: NEGATIVE
Glucose, UA: NEGATIVE mg/dL
Ketones, ur: NEGATIVE mg/dL
Leukocytes,Ua: NEGATIVE
Nitrite: NEGATIVE
Protein, ur: 30 mg/dL — AB
RBC / HPF: >50 RBC/hpf — ABNORMAL HIGH (ref 0–5)
Specific Gravity, Urine: 1.02 (ref 1.005–1.030)
pH: 5 (ref 5.0–8.0)

## 2019-04-13 LAB — LIPASE, BLOOD: Lipase: 18 U/L (ref 11–51)

## 2019-04-13 LAB — COMPREHENSIVE METABOLIC PANEL
ALT: 10 U/L (ref 0–44)
AST: 13 U/L — ABNORMAL LOW (ref 15–41)
Albumin: 5.2 g/dL — ABNORMAL HIGH (ref 3.5–5.0)
Alkaline Phosphatase: 53 U/L (ref 38–126)
Anion gap: 9 (ref 5–15)
BUN: 9 mg/dL (ref 6–20)
CO2: 25 mmol/L (ref 22–32)
Calcium: 9.6 mg/dL (ref 8.9–10.3)
Chloride: 107 mmol/L (ref 98–111)
Creatinine, Ser: 1.01 mg/dL (ref 0.61–1.24)
GFR calc Af Amer: >60 mL/min (ref >60)
GFR calc non Af Amer: >60 mL/min (ref >60)
Glucose, Bld: 107 mg/dL — ABNORMAL HIGH (ref 70–99)
Potassium: 4.2 mmol/L (ref 3.5–5.1)
Sodium: 141 mmol/L (ref 135–145)
Total Bilirubin: 2.4 mg/dL — ABNORMAL HIGH (ref 0.3–1.2)
Total Protein: 7.8 g/dL (ref 6.5–8.1)

## 2019-04-13 LAB — CBC
HCT: 44.5 % (ref 39.0–52.0)
Hemoglobin: 15.4 g/dL (ref 13.0–17.0)
MCH: 29.2 pg (ref 26.0–34.0)
MCHC: 34.6 g/dL (ref 30.0–36.0)
MCV: 84.4 fL (ref 80.0–100.0)
Platelets: 254 10*3/uL (ref 150–400)
RBC: 5.27 MIL/uL (ref 4.22–5.81)
RDW: 11.6 % (ref 11.5–15.5)
WBC: 8.1 10*3/uL (ref 4.0–10.5)
nRBC: 0 % (ref 0.0–0.2)

## 2019-04-13 MED ORDER — SODIUM CHLORIDE 0.9% FLUSH
3.0000 mL | Freq: Once | INTRAVENOUS | Status: AC
  Administered 2019-04-13: 3 mL via INTRAVENOUS

## 2019-04-13 MED ORDER — ONDANSETRON HCL 4 MG/2ML IJ SOLN
4.0000 mg | Freq: Once | INTRAMUSCULAR | Status: AC
  Administered 2019-04-13: 4 mg via INTRAVENOUS
  Filled 2019-04-13: qty 2

## 2019-04-13 MED ORDER — SODIUM CHLORIDE 0.9 % IV BOLUS
500.0000 mL | Freq: Once | INTRAVENOUS | Status: AC
  Administered 2019-04-13: 500 mL via INTRAVENOUS

## 2019-04-13 MED ORDER — MORPHINE SULFATE (PF) 4 MG/ML IV SOLN
4.0000 mg | Freq: Once | INTRAVENOUS | Status: AC
  Administered 2019-04-13: 4 mg via INTRAVENOUS
  Filled 2019-04-13: qty 1

## 2019-04-13 MED ORDER — ONDANSETRON 4 MG PO TBDP: 4.0000 mg | ORAL_TABLET | Freq: Three times a day (TID) | ORAL | 0 refills | Status: DC | PRN

## 2019-04-13 MED ORDER — OXYCODONE-ACETAMINOPHEN 5-325 MG PO TABS: 1.0000 | ORAL_TABLET | Freq: Once | ORAL | Status: DC

## 2019-04-13 MED ORDER — HYDROCODONE-ACETAMINOPHEN 5-325 MG PO TABS: 1.0000 | ORAL_TABLET | Freq: Four times a day (QID) | ORAL | 0 refills | Status: DC | PRN

## 2019-04-13 MED ORDER — SODIUM CHLORIDE 0.9 % IV BOLUS: 1000.0000 mL | Freq: Once | INTRAVENOUS | Status: DC

## 2019-04-13 MED ORDER — HYDROCODONE-ACETAMINOPHEN 5-325 MG PO TABS
1.0000 | ORAL_TABLET | Freq: Once | ORAL | Status: AC
  Administered 2019-04-13: 1 via ORAL
  Filled 2019-04-13: qty 1

## 2019-04-13 MED ORDER — SODIUM CHLORIDE 0.9% FLUSH: 3.0000 mL | Freq: Once | INTRAVENOUS | Status: DC

## 2019-04-13 NOTE — ED Notes (Signed)
Pt verbalizes understanding of follow up care and medication regimen, pt reports having the opportunity to have questions answered

## 2019-04-13 NOTE — Discharge Instructions (Signed)
Today you received medications that may make you sleepy or impair your ability to make decisions.  For the next 24 hours please do not drive, operate heavy machinery, care for a small child with out another adult present, or perform any activities that may cause harm to you or someone else if you were to fall asleep or be impaired.  ° °You are being prescribed a medication which may make you sleepy. Please follow up of listed precautions for at least 24 hours after taking one dose. ° ° °Please take Ibuprofen (Advil, motrin) and Tylenol (acetaminophen) to relieve your pain.  You may take up to 600 MG (3 pills) of normal strength ibuprofen every 8 hours as needed.  In between doses of ibuprofen you make take tylenol, up to 1,000 mg (two extra strength pills).  Do not take more than 3,000 mg tylenol in a 24 hour period.  Please check all medication labels as many medications such as pain and cold medications may contain tylenol.  Do not drink alcohol while taking these medications.  Do not take other NSAID'S while taking ibuprofen (such as aleve or naproxen).  Please take ibuprofen with food to decrease stomach upset. ° °

## 2019-04-13 NOTE — ED Provider Notes (Signed)
[facility] EMERGENCY DEPARTMENT Provider Note   CSN: [id number] Arrival date & time: 04/13/19  1723    History   Chief Complaint Chief Complaint  Patient presents with   Abdominal Pain    HPI Jason Peters is a 28 y.o. male with a past medical history of kidney stones, status post lap appendectomy, who presents today for evaluation of abdominal pain.  He states that he woke up with right-sided mid abdominal pain this morning.  He denies any flank pain.  He reports that the pain shoots into his testicles however does not think his testicles themselves hurt.  He reports nausea and that he has vomited 3 times however he attributes that due to escalating pain.  He denies any fevers.  No recent trauma.  He denies dysuria increased frequency or urgency.  No penile discharge.     HPI  Past Medical History:  Diagnosis Date   Kidney stone     Patient Active Problem List   Diagnosis Date Noted   Acute encephalopathy 04/02/2018   Tobacco abuse 04/02/2018    Past Surgical History:  Procedure Laterality Date   HERNIA REPAIR     LAPAROSCOPIC APPENDECTOMY N/A 10/18/2015   Procedure: APPENDECTOMY LAPAROSCOPIC;  Surgeon: Aviva Signs, MD;  Location: [facility];  Service: General;  Laterality: N/A;        Home Medications    Prior to Admission medications   Medication Sig Start Date End Date Taking? Authorizing Provider  HYDROcodone-acetaminophen (NORCO/VICODIN) 5-325 MG tablet Take 1 tablet by mouth every 6 (six) hours as needed for severe pain. 04/13/19   Lorin Glass, PA-C  ondansetron (ZOFRAN ODT) 4 MG disintegrating tablet Take 1 tablet (4 mg total) by mouth every 8 (eight) hours as needed for nausea or vomiting. 04/13/19   Lorin Glass, PA-C    Family History No family history on file.  Social History Social History   Tobacco Use   Smoking status: Current Every Day Smoker    Packs/day: 1.00    Years: 2.00    Pack years: 2.00   Types: Cigarettes   Smokeless tobacco: Current User    Types: Chew  Substance Use Topics   Alcohol use: No   Drug use: No     Allergies   Sulfa antibiotics   Review of Systems Review of Systems  Constitutional: Negative for chills and fever.  Gastrointestinal: Positive for abdominal pain, nausea and vomiting. Negative for constipation and diarrhea.  Genitourinary: Positive for testicular pain. Negative for discharge, dysuria, flank pain, penile pain, penile swelling and urgency.  Musculoskeletal: Negative for back pain and neck pain.  All other systems reviewed and are negative.    Physical Exam Updated Vital Signs BP 109/83    Pulse 68    Temp 98.2 F (36.8 C) (Oral)    Resp 16    SpO2 100%   Physical Exam Vitals signs and nursing note reviewed. Exam conducted with a chaperone present Investment banker, corporate).  Constitutional:      General: He is not in acute distress.    Appearance: He is well-developed. He is not diaphoretic.     Comments: Appears uncomfortable  HENT:     Head: Normocephalic and atraumatic.  Eyes:     General: No scleral icterus.       Right eye: No discharge.        Left eye: No discharge.     Conjunctiva/sclera: Conjunctivae normal.  Neck:     Musculoskeletal: Normal range of  motion.  Cardiovascular:     Rate and Rhythm: Normal rate and regular rhythm.  Pulmonary:     Effort: Pulmonary effort is normal. No respiratory distress.     Breath sounds: No stridor.  Abdominal:     General: Abdomen is flat. Bowel sounds are normal. There is no distension.     Palpations: Abdomen is soft.     Tenderness: There is no abdominal tenderness (Abdominal pain does not change with palpation of the abdomen.  No CVA tenderness to percussion bilaterally.). There is no right CVA tenderness or left CVA tenderness.     Hernia: No hernia is present.  Genitourinary:    Scrotum/Testes: Normal.        Right: Mass, tenderness or swelling not present.        Left: Mass, tenderness or  swelling not present.     Comments: Testes with normal lie Musculoskeletal:        General: No deformity.  Skin:    General: Skin is warm and dry.  Neurological:     General: No focal deficit present.     Mental Status: He is alert and oriented to person, place, and time.     Motor: No abnormal muscle tone.  Psychiatric:        Mood and Affect: Mood normal.        Behavior: Behavior normal.      ED Treatments / Results  Labs-including labs obtained at [facility] earlier today when he initially presented but left without being seen after triage Labs Reviewed  URINALYSIS, ROUTINE W REFLEX MICROSCOPIC - Abnormal; Notable for the following components:      Result Value   Color, Urine AMBER (*)    APPearance CLOUDY (*)    Hgb urine dipstick LARGE (*)    Protein, ur 30 (*)    RBC / HPF >50 (*)    Bacteria, UA FEW (*)    All other components within normal limits   Recent Results (from the past 2160 hour(s))  Lipase, blood     Status: None   Collection Time: 04/13/19  2:18 PM  Result Value Ref Range   Lipase 18 11 - 51 U/L    Comment: Performed at [facility], [address] [place], [place] [address]  Comprehensive metabolic panel     Status: Abnormal   Collection Time: 04/13/19  2:18 PM  Result Value Ref Range   Sodium 141 135 - 145 mmol/L   Potassium 4.2 3.5 - 5.1 mmol/L   Chloride 107 98 - 111 mmol/L   CO2 25 22 - 32 mmol/L   Glucose, Bld 107 (H) 70 - 99 mg/dL   BUN 9 6 - 20 mg/dL   Creatinine, Ser 6.041.01 0.61 - 1.24 mg/dL   Calcium 9.6 8.9 - 54.010.3 mg/dL   Total Protein 7.8 6.5 - 8.1 g/dL   Albumin 5.2 (H) 3.5 - 5.0 g/dL   AST 13 (L) 15 - 41 U/L   ALT 10 0 - 44 U/L   Alkaline Phosphatase 53 38 - 126 U/L   Total Bilirubin 2.4 (H) 0.3 - 1.2 mg/dL   GFR calc non Af Amer >60 >60 mL/min   GFR calc Af Amer >60 >60 mL/min   Anion gap 9 5 - 15    Comment: Performed at [facility], [address] [place], [place] [address]  CBC     Status: None   Collection Time: 04/13/19   2:18 PM  Result Value Ref Range  WBC 8.1 4.0 - 10.5 K/uL   RBC 5.27 4.22 - 5.81 MIL/uL   Hemoglobin 15.4 13.0 - 17.0 g/dL   HCT 16.144.5 09.639.0 - 04.552.0 %   MCV 84.4 80.0 - 100.0 fL   MCH 29.2 26.0 - 34.0 pg   MCHC 34.6 30.0 - 36.0 g/dL   RDW 40.911.6 81.111.5 - 91.415.5 %   Platelets 254 150 - 400 K/uL   nRBC 0.0 0.0 - 0.2 %    Comment: Performed at [facility], [address] [place], [place] [address]  Urinalysis, Routine w reflex microscopic     Status: Abnormal   Collection Time: 04/13/19  6:21 PM  Result Value Ref Range   Color, Urine AMBER (A) YELLOW    Comment: BIOCHEMICALS MAY BE AFFECTED BY COLOR   APPearance CLOUDY (A) CLEAR   Specific Gravity, Urine 1.020 1.005 - 1.030   pH 5.0 5.0 - 8.0   Glucose, UA NEGATIVE NEGATIVE mg/dL   Hgb urine dipstick LARGE (A) NEGATIVE   Bilirubin Urine NEGATIVE NEGATIVE   Ketones, ur NEGATIVE NEGATIVE mg/dL   Protein, ur 30 (A) NEGATIVE mg/dL   Nitrite NEGATIVE NEGATIVE   Leukocytes,Ua NEGATIVE NEGATIVE   RBC / HPF >50 (H) 0 - 5 RBC/hpf   WBC, UA 0-5 0 - 5 WBC/hpf   Bacteria, UA FEW (A) NONE SEEN   Squamous Epithelial / LPF 0-5 0 - 5   Mucus PRESENT    Ca Oxalate Crys, UA PRESENT     Comment: Performed at [facility], [address] [place], [place] [address]     EKG None  Radiology Ct Renal Stone Study  Result Date: 04/13/2019 CLINICAL DATA:  Severe right lower quadrant pain. Vomiting. Hematuria. EXAM: CT ABDOMEN AND PELVIS WITHOUT CONTRAST TECHNIQUE: Multidetector CT imaging of the abdomen and pelvis was performed following the standard protocol without IV contrast. COMPARISON:  CT scan dated 10/17/2015 FINDINGS: Lower chest: Normal. Hepatobiliary: No focal liver abnormality is seen. No gallstones, gallbladder wall thickening, or biliary dilatation. Pancreas: Unremarkable. No pancreatic ductal dilatation or surrounding inflammatory changes. Spleen: Normal in size without focal abnormality. Adrenals/Urinary Tract: There is slight dilatation  of the right renal collecting system. The ureter is slightly dilated to the level of a 2 mm stone in the distal right ureter just proximal to the bladder. No renal calculi. Adrenal glands are normal. Bladder is normal. Stomach/Bowel: Stomach is within normal limits. Appendix has been removed. No evidence of bowel wall thickening, distention, or inflammatory changes. Vascular/Lymphatic: No significant vascular findings are present. No enlarged abdominal or pelvic lymph nodes. Reproductive: Prostate is unremarkable. Other: No abdominal wall hernia or abnormality. No abdominopelvic ascites. Musculoskeletal: No acute or significant osseous findings. IMPRESSION: 1. 2 mm stone in the distal right ureter causing slight right hydronephrosis and hydroureter. 2. Otherwise, benign appearing abdomen and pelvis. Electronically Signed   By: Francene BoyersJames  Maxwell M.D.   On: 04/13/2019 19:59    Procedures Procedures (including critical care time)  Medications Ordered in ED Medications  sodium chloride flush (NS) 0.9 % injection 3 mL (3 mLs Intravenous Given 04/13/19 2021)  sodium chloride 0.9 % bolus 500 mL (0 mLs Intravenous Stopped 04/13/19 2133)  morphine 4 MG/ML injection 4 mg (4 mg Intravenous Given 04/13/19 2018)  ondansetron (ZOFRAN) injection 4 mg (4 mg Intravenous Given 04/13/19 2017)  HYDROcodone-acetaminophen (NORCO/VICODIN) 5-325 MG per tablet 1 tablet (1 tablet Oral Given 04/13/19 2137)     Initial Impression / Assessment and Plan / ED Course  I have reviewed the triage vital signs and the nursing notes.  Pertinent labs & imaging results that were available during my care of the patient were reviewed by me and considered in my medical decision making (see chart for details).       Patient presents today for evaluation of right-sided abdominal pain.  On initial exam he appears uncomfortable and in pain.  Labs that were obtained from different [facility] facility prior to arrival along with UA here were reviewed.   Labs appear consistent with his baseline.  Urine does show large blood, 30 protein.  Microscopy shows calcium oxalate crystals and few bacteria, he does not have dysuria increased frequency or urgency, do not suspect UTI however concern for kidney stone.  CT renal study was obtained given the abnormal location and nature of his pain for a kidney stone and the need to exclude small bowel obstruction, hernia, or other cause of his symptoms.  CT renal study showed a 2 mm right sided ureteral stone with slight right hydronephrosis and hydroureter.  His pain was treated with morphine and Vicodin.  His nausea was treated with Zofran.  After this he wished for discharge.  He is given prescriptions for Zofran and p.o. Vicodin at home after clearing [place] PMP database.  Flomax was considered, however based on his allergy to sulfa antibiotics was not ordered.    He is given urology follow-up.  Discussed that if his symptoms should cause him to seek additional medical care and he wants to go to a [facility] [place] that [facility] is the preferred [facility].  Return precautions were discussed with patient who states their understanding.  At the time of discharge patient denied any unaddressed complaints or concerns.  Patient is agreeable for discharge home.   Final Clinical Impressions(s) / ED Diagnoses   Final diagnoses:  Right ureteral stone    ED Discharge Orders         Ordered    HYDROcodone-acetaminophen (NORCO/VICODIN) 5-325 MG tablet  Every 6 hours PRN     04/13/19 2135    ondansetron (ZOFRAN ODT) 4 MG disintegrating tablet  Every 8 hours PRN     04/13/19 2135           Cristina GongHammond, Zayde Stroupe W, PA-C 04/13/19 [id number]    Gwyneth SproutPlunkett, Whitney, MD 04/14/19 1625

## 2019-04-13 NOTE — ED Notes (Signed)
Reported that pt informed registration of leaving and left.

## 2019-04-13 NOTE — ED Triage Notes (Signed)
Pt reports severe RLQ pain since waking up this morning, vomiting x2. Went to [facility] but LWBS after triage to come here.

## 2019-04-13 NOTE — ED Triage Notes (Signed)
Pt with abd pain emesis x 1 today, starting today.  Denies fever.

## 2019-04-15 ENCOUNTER — Encounter (HOSPITAL_COMMUNITY): Payer: Self-pay | Admitting: Emergency Medicine

## 2019-04-15 ENCOUNTER — Other Ambulatory Visit: Payer: Self-pay

## 2019-04-15 ENCOUNTER — Emergency Department (HOSPITAL_COMMUNITY)
Admission: EM | Admit: 2019-04-15 | Discharge: 2019-04-16 | Disposition: A | Discharge status: Home / Self Care | Payer: BC Managed Care – PPO | Attending: Emergency Medicine | Admitting: Emergency Medicine

## 2019-04-15 DIAGNOSIS — R109 Unspecified abdominal pain: Secondary | ICD-10-CM | POA: Diagnosis present

## 2019-04-15 DIAGNOSIS — F17210 Nicotine dependence, cigarettes, uncomplicated: Secondary | ICD-10-CM | POA: Diagnosis not present

## 2019-04-15 DIAGNOSIS — N23 Unspecified renal colic: Secondary | ICD-10-CM | POA: Diagnosis not present

## 2019-04-15 MED ORDER — HYDROMORPHONE HCL 1 MG/ML IJ SOLN
1.0000 mg | Freq: Once | INTRAMUSCULAR | Status: AC
  Administered 2019-04-15: 1 mg via INTRAVENOUS
  Filled 2019-04-15: qty 1

## 2019-04-15 MED ORDER — KETOROLAC TROMETHAMINE 30 MG/ML IJ SOLN
30.0000 mg | Freq: Once | INTRAMUSCULAR | Status: AC
  Administered 2019-04-15: 30 mg via INTRAVENOUS
  Filled 2019-04-15: qty 1

## 2019-04-15 MED ORDER — ONDANSETRON HCL 4 MG/2ML IJ SOLN
4.0000 mg | Freq: Once | INTRAMUSCULAR | Status: AC
  Administered 2019-04-15: 4 mg via INTRAVENOUS
  Filled 2019-04-15: qty 2

## 2019-04-15 NOTE — ED Triage Notes (Addendum)
Pt states he "has a kidney stone". States he has had RLQ abd pain that radiates to flank x 2-3 days. Was triaged here on 04/13/19 for same complaint but left after triage d/t "wait times" and went to [facility] where he states they dx him with kidney stone. States has been straining urine but has not passed stone. Pt states he was given pain medicine at discharge but is currently out of it.

## 2019-04-15 NOTE — ED Provider Notes (Signed)
[facility] EMERGENCY DEPARTMENT Provider Note   CSN: [id number] Arrival date & time: 04/15/19  2256    History   Chief Complaint Chief Complaint  Patient presents with  . Abdominal Pain    HPI Jason Peters is a 28 y.o. male.     Patient presents to the emergency department for evaluation of right-sided lower abdominal and flank pain.  Symptoms present for 3 days.  Patient seen at [facility] 2 days ago and diagnosed with kidney stone.  Patient reports that pain increased today.  When the pain is severe and sharp he has nausea and has vomited once or twice.  He took all of the pain medicine he was prescribed, now has nothing at home for this pain.     Past Medical History:  Diagnosis Date  . Kidney stone     Patient Active Problem List   Diagnosis Date Noted  . Acute encephalopathy 04/02/2018  . Tobacco abuse 04/02/2018    Past Surgical History:  Procedure Laterality Date  . HERNIA REPAIR    . LAPAROSCOPIC APPENDECTOMY N/A 10/18/2015   Procedure: APPENDECTOMY LAPAROSCOPIC;  Surgeon: Franky MachoMark Jenkins, MD;  Location: [facility];  Service: General;  Laterality: N/A;        Home Medications    Prior to Admission medications   Medication Sig Start Date End Date Taking? Authorizing Provider  HYDROcodone-acetaminophen (NORCO/VICODIN) 5-325 MG tablet Take 1 tablet by mouth every 6 (six) hours as needed for severe pain. 04/13/19  Yes Cristina GongHammond, Elizabeth W, PA-C  ondansetron (ZOFRAN ODT) 4 MG disintegrating tablet Take 1 tablet (4 mg total) by mouth every 8 (eight) hours as needed for nausea or vomiting. 04/13/19  Yes Cristina GongHammond, Elizabeth W, PA-C    Family History No family history on file.  Social History Social History   Tobacco Use  . Smoking status: Current Every Day Smoker    Packs/day: 1.00    Years: 2.00    Pack years: 2.00    Types: Cigarettes  . Smokeless tobacco: Current User    Types: Chew  Substance Use Topics  . Alcohol use: No  . Drug use: No      Allergies   Sulfa antibiotics   Review of Systems Review of Systems  Gastrointestinal: Positive for nausea and vomiting.  Genitourinary: Positive for flank pain.  All other systems reviewed and are negative.    Physical Exam Updated Vital Signs BP (!) 163/89 (BP Location: Right Arm)   Pulse 74   Temp 98.3 F (36.8 C) (Oral)   Resp 17   Ht 6\' 2"  (1.88 m)   Wt 83.9 kg   SpO2 99%   BMI 23.75 kg/m   Physical Exam Vitals signs and nursing note reviewed.  Constitutional:      General: He is in acute distress.     Appearance: Normal appearance. He is well-developed.  HENT:     Head: Normocephalic and atraumatic.     Right Ear: Hearing normal.     Left Ear: Hearing normal.     Nose: Nose normal.  Eyes:     Conjunctiva/sclera: Conjunctivae normal.     Pupils: Pupils are equal, round, and reactive to light.  Neck:     Musculoskeletal: Normal range of motion and neck supple.  Cardiovascular:     Rate and Rhythm: Regular rhythm.     Heart sounds: S1 normal and S2 normal. No murmur. No friction rub. No gallop.   Pulmonary:     Effort: Pulmonary effort is  normal. No respiratory distress.     Breath sounds: Normal breath sounds.  Chest:     Chest wall: No tenderness.  Abdominal:     General: Bowel sounds are normal.     Palpations: Abdomen is soft.     Tenderness: There is no abdominal tenderness. There is right CVA tenderness. There is no guarding or rebound. Negative signs include Murphy's sign and McBurney's sign.     Hernia: No hernia is present.  Musculoskeletal: Normal range of motion.  Skin:    General: Skin is warm and dry.     Findings: No rash.  Neurological:     Mental Status: He is alert and oriented to person, place, and time.     GCS: GCS eye subscore is 4. GCS verbal subscore is 5. GCS motor subscore is 6.     Cranial Nerves: No cranial nerve deficit.     Sensory: No sensory deficit.     Coordination: Coordination normal.  Psychiatric:        Speech:  Speech normal.        Behavior: Behavior normal.        Thought Content: Thought content normal.      ED Treatments / Results  Labs (all labs ordered are listed, but only abnormal results are displayed) Labs Reviewed  URINALYSIS, ROUTINE W REFLEX MICROSCOPIC    EKG None  Radiology No results found.  Procedures Procedures (including critical care time)  Medications Ordered in ED Medications  ketorolac (TORADOL) 30 MG/ML injection 30 mg (30 mg Intravenous Given 04/15/19 2339)  HYDROmorphone (DILAUDID) injection 1 mg (1 mg Intravenous Given 04/15/19 2338)  ondansetron (ZOFRAN) injection 4 mg (4 mg Intravenous Given 04/15/19 2342)     Initial Impression / Assessment and Plan / ED Course  I have reviewed the triage vital signs and the nursing notes.  Pertinent labs & imaging results that were available during my care of the patient were reviewed by me and considered in my medical decision making (see chart for details).        Patient presents to the emergency department for evaluation of persistent right lower quadrant abdominal pain with flank pain, nausea and vomiting.  Pain is been present for 3 days.  He was seen in the ER at [facility] 2 days ago and CAT scan showed distal ureteral stone.  Pain has worsened but is consistent with the pain he was experiencing at time of diagnosis of the kidney stone.  He does not need further imaging.  Patient provided additional analgesia here in the ER and at discharge.  Final Clinical Impressions(s) / ED Diagnoses   Final diagnoses:  Renal colic on right side    ED Discharge Orders    None       Orpah Greek, MD 04/15/19 [phone]

## 2019-04-16 MED ORDER — TAMSULOSIN HCL 0.4 MG PO CAPS: 0.4000 mg | ORAL_CAPSULE | Freq: Every day | ORAL | 0 refills | Status: DC

## 2019-04-16 MED ORDER — HYDROMORPHONE HCL 1 MG/ML IJ SOLN
1.0000 mg | Freq: Once | INTRAMUSCULAR | Status: AC
  Administered 2019-04-16: 1 mg via INTRAMUSCULAR
  Filled 2019-04-16: qty 1

## 2019-04-16 MED ORDER — OXYCODONE-ACETAMINOPHEN 5-325 MG PO TABS: 2.0000 | ORAL_TABLET | ORAL | 0 refills | Status: DC | PRN

## 2019-04-19 MED FILL — Oxycodone w/ Acetaminophen Tab 5-325 MG: ORAL | Qty: 6 | Status: AC

## 2019-04-21 ENCOUNTER — Encounter (HOSPITAL_COMMUNITY): Payer: Self-pay | Admitting: Emergency Medicine

## 2019-04-21 ENCOUNTER — Emergency Department (HOSPITAL_COMMUNITY)
Admission: EM | Admit: 2019-04-21 | Discharge: 2019-04-21 | Disposition: A | Discharge status: Home / Self Care | Payer: BC Managed Care – PPO | Attending: Emergency Medicine | Admitting: Emergency Medicine

## 2019-04-21 DIAGNOSIS — Z09 Encounter for follow-up examination after completed treatment for conditions other than malignant neoplasm: Secondary | ICD-10-CM | POA: Insufficient documentation

## 2019-04-21 DIAGNOSIS — F17210 Nicotine dependence, cigarettes, uncomplicated: Secondary | ICD-10-CM | POA: Diagnosis not present

## 2019-04-21 NOTE — ED Provider Notes (Signed)
[place] EMERGENCY DEPARTMENT Provider Note   CSN: [id number] Arrival date & time: 04/21/19  1520    History   Chief Complaint Chief Complaint  Patient presents with  . Letter for School/Work    HPI Jason Peters is a 28 y.o. male.     HPI   28 year old male presents today for work note.  He notes last week he had a kidney stone.  He was unable return to work on the date that he was given.  He tried to go back and work but was told he needed a note to return.  He is unsure if he passed the stone but has no more pain, no nausea vomiting or fever.  Past Medical History:  Diagnosis Date  . Kidney stone     Patient Active Problem List   Diagnosis Date Noted  . Acute encephalopathy 04/02/2018  . Tobacco abuse 04/02/2018    Past Surgical History:  Procedure Laterality Date  . HERNIA REPAIR    . LAPAROSCOPIC APPENDECTOMY N/A 10/18/2015   Procedure: APPENDECTOMY LAPAROSCOPIC;  Surgeon: Aviva Signs, MD;  Location: [facility];  Service: General;  Laterality: N/A;        Home Medications    Prior to Admission medications   Medication Sig Start Date End Date Taking? Authorizing Provider  HYDROcodone-acetaminophen (NORCO/VICODIN) 5-325 MG tablet Take 1 tablet by mouth every 6 (six) hours as needed for severe pain. 04/13/19   Lorin Glass, PA-C  ondansetron (ZOFRAN ODT) 4 MG disintegrating tablet Take 1 tablet (4 mg total) by mouth every 8 (eight) hours as needed for nausea or vomiting. 04/13/19   Lorin Glass, PA-C  oxyCODONE-acetaminophen (PERCOCET) 5-325 MG tablet Take 2 tablets by mouth every 4 (four) hours as needed. 04/16/19   Orpah Greek, MD  tamsulosin (FLOMAX) 0.4 MG CAPS capsule Take 1 capsule (0.4 mg total) by mouth daily. 04/16/19   Orpah Greek, MD    Family History No family history on file.  Social History Social History   Tobacco Use  . Smoking status: Current Every Day Smoker    Packs/day: 1.00   Years: 2.00    Pack years: 2.00    Types: Cigarettes  . Smokeless tobacco: Current User    Types: Chew  Substance Use Topics  . Alcohol use: No  . Drug use: No     Allergies   Sulfa antibiotics   Review of Systems Review of Systems  All other systems reviewed and are negative.    Physical Exam Updated Vital Signs BP 104/67 (BP Location: Right Arm)   Pulse 68   Temp 98 F (36.7 C) (Oral)   Resp 16   SpO2 100%   Physical Exam Vitals signs and nursing note reviewed.  Constitutional:      Appearance: He is well-developed.  HENT:     Head: Normocephalic and atraumatic.  Eyes:     General: No scleral icterus.       Right eye: No discharge.        Left eye: No discharge.     Conjunctiva/sclera: Conjunctivae normal.     Pupils: Pupils are equal, round, and reactive to light.  Neck:     Musculoskeletal: Normal range of motion.     Vascular: No JVD.     Trachea: No tracheal deviation.  Pulmonary:     Effort: Pulmonary effort is normal.     Breath sounds: No stridor.  Neurological:     Mental Status: He  is alert and oriented to person, place, and time.     Coordination: Coordination normal.  Psychiatric:        Behavior: Behavior normal.        Thought Content: Thought content normal.        Judgment: Judgment normal.      ED Treatments / Results  Labs (all labs ordered are listed, but only abnormal results are displayed) Labs Reviewed - No data to display  EKG None  Radiology No results found.  Procedures Procedures (including critical care time)  Medications Ordered in ED Medications - No data to display   Initial Impression / Assessment and Plan / ED Course  I have reviewed the triage vital signs and the nursing notes.  Pertinent labs & imaging results that were available during my care of the patient were reviewed by me and considered in my medical decision making (see chart for details).        28 year old male presents today for work  note.  He is cleared to return to work.  Final Clinical Impressions(s) / ED Diagnoses   Final diagnoses:  Follow-up exam    ED Discharge Orders    None       Rosalio LoudHedges, Aianna Fahs, PA-C 04/21/19 1706    Charlynne PanderYao, David Hsienta, MD 04/21/19 [phone]

## 2019-04-21 NOTE — ED Triage Notes (Signed)
Pt was seen at [facility] on 7/23 was given a note to go back to work last Friday however pt went back today and they told him he needed a new note that said he could go back today.Marland Kitchen

## 2019-04-21 NOTE — Discharge Instructions (Signed)
Return the emergency room if you develop any new or worsening signs or symptoms.

## 2019-05-27 ENCOUNTER — Other Ambulatory Visit: Payer: Self-pay

## 2019-05-27 ENCOUNTER — Emergency Department (HOSPITAL_COMMUNITY)
Admission: EM | Admit: 2019-05-27 | Discharge: 2019-05-27 | Disposition: A | Discharge status: Home / Self Care | Payer: BC Managed Care – PPO | Attending: Emergency Medicine | Admitting: Emergency Medicine

## 2019-05-27 ENCOUNTER — Encounter (HOSPITAL_COMMUNITY): Payer: Self-pay | Admitting: Emergency Medicine

## 2019-05-27 DIAGNOSIS — Z0000 Encounter for general adult medical examination without abnormal findings: Secondary | ICD-10-CM

## 2019-05-27 DIAGNOSIS — Z87891 Personal history of nicotine dependence: Secondary | ICD-10-CM | POA: Insufficient documentation

## 2019-05-27 NOTE — ED Triage Notes (Signed)
Patient states "Last Tuesday someone told my boss I was throwing up in the parking lot so he made me leave and told me I had to have a doctor's note before I could come back to work." Patient denies vomiting and has no symptoms for last week. Denies pain, fever, or cough.

## 2019-05-27 NOTE — Discharge Instructions (Signed)
You are cleared to return to work.

## 2019-05-27 NOTE — ED Provider Notes (Signed)
[facility] EMERGENCY DEPARTMENT Provider Note   CSN: [id number] Arrival date & time: 05/27/19  2004     History   Chief Complaint Chief Complaint  Patient presents with  . Needs Work Note    HPI Jason Peters is a 28 y.o. male with past medical history of kidney stones and acute encephalopathy essentially presenting in need of a work note.  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  Regardless, the supervisor made him go home and he is required to have a work note in order to return.  He denies having any symptoms including fevers, chills, n/v, abdominal pain, diarrhea.  He has attempted to get an appt with his pcp to get released for work but has some copay issues so cannot be seen there currently.       The history is provided by the patient.    Past Medical History:  Diagnosis Date  . Kidney stone     Patient Active Problem List   Diagnosis Date Noted  . Acute encephalopathy 04/02/2018  . Tobacco abuse 04/02/2018    Past Surgical History:  Procedure Laterality Date  . HERNIA REPAIR    . LAPAROSCOPIC APPENDECTOMY N/A 10/18/2015   Procedure: APPENDECTOMY LAPAROSCOPIC;  Surgeon: Aviva Signs, MD;  Location: [facility];  Service: General;  Laterality: N/A;        Home Medications    Prior to Admission medications   Medication Sig Start Date End Date Taking? Authorizing Provider  HYDROcodone-acetaminophen (NORCO/VICODIN) 5-325 MG tablet Take 1 tablet by mouth every 6 (six) hours as needed for severe pain. 04/13/19   Lorin Glass, PA-C  ondansetron (ZOFRAN ODT) 4 MG disintegrating tablet Take 1 tablet (4 mg total) by mouth every 8 (eight) hours as needed for nausea or vomiting. 04/13/19   Lorin Glass, PA-C  oxyCODONE-acetaminophen (PERCOCET) 5-325 MG tablet Take 2 tablets by mouth every 4 (four) hours as needed. 04/16/19   Orpah Greek, MD   tamsulosin (FLOMAX) 0.4 MG CAPS capsule Take 1 capsule (0.4 mg total) by mouth daily. 04/16/19   Orpah Greek, MD    Family History History reviewed. No pertinent family history.  Social History Social History   Tobacco Use  . Smoking status: Former Smoker    Packs/day: 1.00    Years: 2.00    Pack years: 2.00    Types: Cigarettes    Quit date: 03/26/2019    Years since quitting: 0.1  . Smokeless tobacco: Former Systems developer    Types: Chew  Substance Use Topics  . Alcohol use: No  . Drug use: No     Allergies   Sulfa antibiotics   Review of Systems Review of Systems  Constitutional: Negative for chills, fatigue and fever.  HENT: Negative for congestion and sore throat.   Eyes: Negative.   Respiratory: Negative for chest tightness and shortness of breath.   Cardiovascular: Negative for chest pain.  Gastrointestinal: Negative for abdominal pain, diarrhea, nausea and vomiting.  Genitourinary: Negative.   Musculoskeletal: Negative for arthralgias, joint swelling and neck pain.  Skin: Negative.  Negative for rash and wound.  Neurological: Negative for dizziness, weakness, light-headedness, numbness and headaches.  Psychiatric/Behavioral: Negative.   All other systems reviewed and are negative.    Physical Exam Updated Vital Signs BP 127/82 (BP Location: Right Arm)   Pulse 77  Temp 98 F (36.7 C) (Oral)   Resp 18   Ht 6\' 2"  (1.88 m)   Wt 83.9 kg   SpO2 99%   BMI 23.75 kg/m   Physical Exam Vitals signs and nursing note reviewed.  Constitutional:      Appearance: Normal appearance. He is well-developed.  HENT:     Head: Normocephalic and atraumatic.  Cardiovascular:     Rate and Rhythm: Normal rate.  Pulmonary:     Effort: Pulmonary effort is normal.     Breath sounds: No wheezing.  Abdominal:     General: Bowel sounds are normal.     Palpations: Abdomen is soft.  Musculoskeletal: Normal range of motion.  Skin:    General: Skin is warm and dry.   Neurological:     Mental Status: He is alert.      ED Treatments / Results  Labs (all labs ordered are listed, but only abnormal results are displayed) Labs Reviewed - No data to display  EKG None  Radiology No results found.  Procedures Procedures (including critical care time)  Medications Ordered in ED Medications - No data to display   Initial Impression / Assessment and Plan / ED Course  I have reviewed the triage vital signs and the nursing notes.  Pertinent labs & imaging results that were available during my care of the patient were reviewed by me and considered in my medical decision making (see chart for details).        Work note provided. Normal exam.   Final Clinical Impressions(s) / ED Diagnoses   Final diagnoses:  Normal exam    ED Discharge Orders    None       Victoriano Laindol, Daris Harkins, PA-C 05/27/19 2130    Terrilee FilesButler, Michael C, MD 05/28/19 1049

## 2019-06-05 ENCOUNTER — Emergency Department (HOSPITAL_COMMUNITY)
Admission: EM | Admit: 2019-06-05 | Discharge: 2019-06-05 | Disposition: A | Discharge status: Home / Self Care | Payer: BC Managed Care – PPO | Attending: Emergency Medicine | Admitting: Emergency Medicine

## 2019-06-05 ENCOUNTER — Encounter (HOSPITAL_COMMUNITY): Payer: Self-pay | Admitting: Emergency Medicine

## 2019-06-05 ENCOUNTER — Other Ambulatory Visit: Payer: Self-pay

## 2019-06-05 DIAGNOSIS — Z87891 Personal history of nicotine dependence: Secondary | ICD-10-CM | POA: Insufficient documentation

## 2019-06-05 DIAGNOSIS — X58XXXA Exposure to other specified factors, initial encounter: Secondary | ICD-10-CM | POA: Diagnosis not present

## 2019-06-05 DIAGNOSIS — Y999 Unspecified external cause status: Secondary | ICD-10-CM | POA: Diagnosis not present

## 2019-06-05 DIAGNOSIS — Z79899 Other long term (current) drug therapy: Secondary | ICD-10-CM | POA: Diagnosis not present

## 2019-06-05 DIAGNOSIS — H1033 Unspecified acute conjunctivitis, bilateral: Secondary | ICD-10-CM

## 2019-06-05 DIAGNOSIS — Y939 Activity, unspecified: Secondary | ICD-10-CM | POA: Insufficient documentation

## 2019-06-05 DIAGNOSIS — G501 Atypical facial pain: Secondary | ICD-10-CM | POA: Diagnosis present

## 2019-06-05 DIAGNOSIS — S301XXA Contusion of abdominal wall, initial encounter: Secondary | ICD-10-CM | POA: Insufficient documentation

## 2019-06-05 DIAGNOSIS — Y929 Unspecified place or not applicable: Secondary | ICD-10-CM | POA: Insufficient documentation

## 2019-06-05 DIAGNOSIS — Z20828 Contact with and (suspected) exposure to other viral communicable diseases: Secondary | ICD-10-CM | POA: Insufficient documentation

## 2019-06-05 MED ORDER — TOBRAMYCIN 0.3 % OP SOLN: 1.0000 [drp] | OPHTHALMIC | 0 refills | Status: AC

## 2019-06-05 NOTE — Discharge Instructions (Signed)
Return if any problems.  You have a covid test pending.

## 2019-06-05 NOTE — ED Triage Notes (Signed)
Patient c/o a knot in the left side of his abdomen that he noticed about 4 days ago. Patient states for past week he has had some facial pain, tearful eyes and waking up with crust on his eyes. Reports taking benadryl w/o relief.

## 2019-06-05 NOTE — ED Provider Notes (Signed)
[facility] EMERGENCY DEPARTMENT Provider Note   CSN: [id number] Arrival date & time: 06/05/19  1542     History   Chief Complaint Chief Complaint  Patient presents with  . Facial Pain    HPI Jason Peters is a 28 y.o. male.     The history is provided by the patient. No language interpreter was used.  Conjunctivitis This is a new problem. The current episode started more than 1 week ago. The problem occurs constantly. The problem has not changed since onset.Pertinent negatives include no chest pain and no abdominal pain. Nothing aggravates the symptoms. Nothing relieves the symptoms. He has tried nothing for the symptoms.  Pt complains of both eyes being red.  Pt states he also has a bruise on his left abdomen that he does not know how he got.   Past Medical History:  Diagnosis Date  . Kidney stone     Patient Active Problem List   Diagnosis Date Noted  . Acute encephalopathy 04/02/2018  . Tobacco abuse 04/02/2018    Past Surgical History:  Procedure Laterality Date  . HERNIA REPAIR    . LAPAROSCOPIC APPENDECTOMY N/A 10/18/2015   Procedure: APPENDECTOMY LAPAROSCOPIC;  Surgeon: Franky MachoMark Jenkins, MD;  Location: [facility];  Service: General;  Laterality: N/A;        Home Medications    Prior to Admission medications   Medication Sig Start Date End Date Taking? Authorizing Provider  HYDROcodone-acetaminophen (NORCO/VICODIN) 5-325 MG tablet Take 1 tablet by mouth every 6 (six) hours as needed for severe pain. 04/13/19   Cristina GongHammond, Elizabeth W, PA-C  ondansetron (ZOFRAN ODT) 4 MG disintegrating tablet Take 1 tablet (4 mg total) by mouth every 8 (eight) hours as needed for nausea or vomiting. 04/13/19   Cristina GongHammond, Elizabeth W, PA-C  oxyCODONE-acetaminophen (PERCOCET) 5-325 MG tablet Take 2 tablets by mouth every 4 (four) hours as needed. 04/16/19   Gilda CreasePollina, Christopher J, MD  tamsulosin (FLOMAX) 0.4 MG CAPS capsule Take 1 capsule (0.4 mg total) by mouth daily.  04/16/19   Gilda CreasePollina, Christopher J, MD  tobramycin (TOBREX) 0.3 % ophthalmic solution Place 1 drop into the right eye every 4 (four) hours for 10 days. 06/05/19 06/15/19  Elson AreasSofia, Denica Web K, PA-C    Family History No family history on file.  Social History Social History   Tobacco Use  . Smoking status: Former Smoker    Packs/day: 1.00    Years: 2.00    Pack years: 2.00    Types: Cigarettes    Quit date: 03/26/2019    Years since quitting: 0.1  . Smokeless tobacco: Former NeurosurgeonUser    Types: Chew  Substance Use Topics  . Alcohol use: No  . Drug use: No     Allergies   Sulfa antibiotics   Review of Systems Review of Systems  Eyes: Positive for pain and redness.  Cardiovascular: Negative for chest pain.  Gastrointestinal: Negative for abdominal pain.  All other systems reviewed and are negative.    Physical Exam Updated Vital Signs BP 129/70 (BP Location: Right Arm)   Pulse 60   Temp (!) 97.5 F (36.4 C) (Oral)   Resp 16   SpO2 95%   Physical Exam Vitals signs and nursing note reviewed.  Constitutional:      Appearance: He is well-developed.  HENT:     Head: Normocephalic and atraumatic.     Nose: Nose normal.     Mouth/Throat:     Mouth: Mucous membranes are moist.  Eyes:     General:        Right eye: Discharge present.        Left eye: Discharge present. Neck:     Musculoskeletal: Neck supple.  Cardiovascular:     Rate and Rhythm: Normal rate and regular rhythm.     Heart sounds: No murmur.  Pulmonary:     Effort: Pulmonary effort is normal. No respiratory distress.     Breath sounds: Normal breath sounds.  Abdominal:     Palpations: Abdomen is soft.     Tenderness: There is no abdominal tenderness.  Musculoskeletal: Normal range of motion.  Skin:    General: Skin is warm and dry.  Neurological:     General: No focal deficit present.     Mental Status: He is alert.  Psychiatric:        Mood and Affect: Mood normal.      ED Treatments / Results   Labs (all labs ordered are listed, but only abnormal results are displayed) Labs Reviewed  NOVEL CORONAVIRUS, NAA (HOSP ORDER, SEND-OUT TO REF LAB; TAT 18-24 HRS)    EKG None  Radiology No results found.  Procedures Procedures (including critical care time)  Medications Ordered in ED Medications - No data to display   Initial Impression / Assessment and Plan / ED Course  I have reviewed the triage vital signs and the nursing notes.  Pertinent labs & imaging results that were available during my care of the patient were reviewed by me and considered in my medical decision making (see chart for details).        MDM   I suspect viral illness,  Covid test ordered.  Pt reports crusting in am.  I will treat with tobrex.    Final Clinical Impressions(s) / ED Diagnoses   Final diagnoses:  Acute conjunctivitis of both eyes, unspecified acute conjunctivitis type  Contusion of abdominal wall, initial encounter    ED Discharge Orders         Ordered    tobramycin (TOBREX) 0.3 % ophthalmic solution  Every 4 hours     06/05/19 1834        An After Visit Summary was printed and given to the patient.    [name] 06/05/19 2326    Virgel Manifold, MD 06/06/19 [phone]

## 2019-06-05 NOTE — ED Notes (Signed)
Patient verbalizes understanding of discharge instructions. Opportunity for questioning and answers were provided. Armband removed by staff, pt discharged from ED ambulatory.   

## 2019-06-06 LAB — NOVEL CORONAVIRUS, NAA (HOSP ORDER, SEND-OUT TO REF LAB; TAT 18-24 HRS): SARS-CoV-2, NAA: NOT DETECTED

## 2019-06-13 ENCOUNTER — Encounter (HOSPITAL_COMMUNITY): Payer: Self-pay | Admitting: Emergency Medicine

## 2019-06-13 ENCOUNTER — Other Ambulatory Visit: Payer: Self-pay

## 2019-06-13 ENCOUNTER — Emergency Department (HOSPITAL_COMMUNITY)
Admission: EM | Admit: 2019-06-13 | Discharge: 2019-06-13 | Disposition: A | Discharge status: Home / Self Care | Payer: BC Managed Care – PPO | Attending: Emergency Medicine | Admitting: Emergency Medicine

## 2019-06-13 DIAGNOSIS — Z0289 Encounter for other administrative examinations: Secondary | ICD-10-CM | POA: Diagnosis not present

## 2019-06-13 DIAGNOSIS — R112 Nausea with vomiting, unspecified: Secondary | ICD-10-CM | POA: Insufficient documentation

## 2019-06-13 DIAGNOSIS — Z87891 Personal history of nicotine dependence: Secondary | ICD-10-CM | POA: Insufficient documentation

## 2019-06-13 DIAGNOSIS — R110 Nausea: Secondary | ICD-10-CM

## 2019-06-13 DIAGNOSIS — R509 Fever, unspecified: Secondary | ICD-10-CM | POA: Diagnosis not present

## 2019-06-13 DIAGNOSIS — Z7689 Persons encountering health services in other specified circumstances: Secondary | ICD-10-CM

## 2019-06-13 NOTE — ED Triage Notes (Signed)
Pt reports that last Thursday and Friday he was sick with nausea. Pt reports he called out of work those two days and employer will not let him come back until he has a work note clearing him of sickness. Pt denies any symptoms since last Friday. Denies fever. Eating and drinking normal. "I just need a work note"

## 2019-06-13 NOTE — ED Provider Notes (Signed)
[facility] EMERGENCY DEPARTMENT Provider Note   CSN: [id number] Arrival date & time: 06/13/19  1858     History   Chief Complaint Chief Complaint  Patient presents with  . Follow-up    work note    HPI Jason Peters is a 28 y.o. male presenting for return to work note.  Patient states 5 days ago he had fever and nausea.  Did not take his temperature, but felt hot and sweaty.  This lasted for 1 day before symptoms resolved without intervention.  He has been fever free for the past 3 days.  Patient states his work is requiring a note to return.  He denies nasal congestion, sore throat, cough, chest pain, shortness of breath, abdominal pain, urinary symptoms, abnormal bowel movements.  He denies sick contacts or contact with known COVID-19 positive person.     HPI  Past Medical History:  Diagnosis Date  . Kidney stone     Patient Active Problem List   Diagnosis Date Noted  . Acute encephalopathy 04/02/2018  . Tobacco abuse 04/02/2018    Past Surgical History:  Procedure Laterality Date  . HERNIA REPAIR    . LAPAROSCOPIC APPENDECTOMY N/A 10/18/2015   Procedure: APPENDECTOMY LAPAROSCOPIC;  Surgeon: Aviva Signs, MD;  Location: [facility];  Service: General;  Laterality: N/A;        Home Medications    Prior to Admission medications   Medication Sig Start Date End Date Taking? Authorizing Provider  HYDROcodone-acetaminophen (NORCO/VICODIN) 5-325 MG tablet Take 1 tablet by mouth every 6 (six) hours as needed for severe pain. 04/13/19   Lorin Glass, PA-C  ondansetron (ZOFRAN ODT) 4 MG disintegrating tablet Take 1 tablet (4 mg total) by mouth every 8 (eight) hours as needed for nausea or vomiting. 04/13/19   Lorin Glass, PA-C  oxyCODONE-acetaminophen (PERCOCET) 5-325 MG tablet Take 2 tablets by mouth every 4 (four) hours as needed. 04/16/19   Orpah Greek, MD  tamsulosin (FLOMAX) 0.4 MG CAPS capsule Take 1 capsule (0.4 mg total) by mouth daily.  04/16/19   Orpah Greek, MD  tobramycin (TOBREX) 0.3 % ophthalmic solution Place 1 drop into the right eye every 4 (four) hours for 10 days. 06/05/19 06/15/19  Fransico Meadow, PA-C    Family History History reviewed. No pertinent family history.  Social History Social History   Tobacco Use  . Smoking status: Former Smoker    Packs/day: 1.00    Years: 2.00    Pack years: 2.00    Types: Cigarettes    Quit date: 03/26/2019    Years since quitting: 0.2  . Smokeless tobacco: Former Systems developer    Types: Chew  Substance Use Topics  . Alcohol use: No  . Drug use: No     Allergies   Sulfa antibiotics   Review of Systems Review of Systems  Constitutional: Positive for fever (subjective, resolved).  Gastrointestinal: Positive for nausea.     Physical Exam Updated Vital Signs BP 134/81 (BP Location: Right Arm)   Pulse 70   Temp 98.2 F (36.8 C) (Oral)   Resp 18   Ht 6\' 2"  (1.88 m)   Wt 83.9 kg   SpO2 100%   BMI 23.75 kg/m   Physical Exam Vitals signs and nursing note reviewed.  Constitutional:      General: He is not in acute distress.    Appearance: He is well-developed.     Comments: Sitting comfortably in bed in no acute distress  HENT:  Head: Normocephalic and atraumatic.  Neck:     Musculoskeletal: Normal range of motion.  Cardiovascular:     Rate and Rhythm: Normal rate and regular rhythm.     Pulses: Normal pulses.  Pulmonary:     Effort: Pulmonary effort is normal.     Breath sounds: Normal breath sounds.     Comments: Speaking in full sentences.  Clear lung sounds in all fields. Abdominal:     General: There is no distension.     Palpations: There is no mass.     Tenderness: There is no abdominal tenderness. There is no guarding or rebound.     Comments: No tenderness to palpation of the abdomen.  Soft without rigidity, guarding, distention.  Musculoskeletal: Normal range of motion.  Skin:    General: Skin is warm.     Capillary Refill:  Capillary refill takes less than 2 seconds.     Findings: No rash.  Neurological:     Mental Status: He is alert and oriented to person, place, and time.      ED Treatments / Results  Labs (all labs ordered are listed, but only abnormal results are displayed) Labs Reviewed - No data to display  EKG None  Radiology No results found.  Procedures Procedures (including critical care time)  Medications Ordered in ED Medications - No data to display   Initial Impression / Assessment and Plan / ED Course  I have reviewed the triage vital signs and the nursing notes.  Pertinent labs & imaging results that were available during my care of the patient were reviewed by me and considered in my medical decision making (see chart for details).        Patient presenting for work note stating he is allowed to go back to work.  Physical exam shows patient appears nontoxic.  Patient had 1 day of subjective fever and nausea which resolved without intervention.  No respiratory symptoms including cough or shortness of breath.  Low suspicion for COVID-19 infection.  Likely other viral illness.  As he has been fever free for several days, I believe he is safe to go back to work.  At this time, patient appears safe for discharge.  Return precautions given.  Patient states he understands and agrees to plan.  Final Clinical Impressions(s) / ED Diagnoses   Final diagnoses:  Fever, unspecified fever cause  Nausea  Return to work evaluation    ED Discharge Orders    None       Alveria ApleyCaccavale, Hykeem Ojeda, PA-C 06/13/19 2305    Samuel JesterMcManus, Kathleen, DO 06/17/19 [phone]

## 2019-06-13 NOTE — Discharge Instructions (Addendum)
Return to the emergency room with any new, worsening, concerning symptoms. °

## 2019-06-26 ENCOUNTER — Emergency Department (HOSPITAL_COMMUNITY): Payer: Medicaid Other

## 2019-06-26 ENCOUNTER — Emergency Department (HOSPITAL_COMMUNITY)
Admission: EM | Admit: 2019-06-26 | Discharge: 2019-06-26 | Disposition: A | Discharge status: Other Acute Inpatient Hospital | Payer: Medicaid Other | Attending: Emergency Medicine | Admitting: Emergency Medicine

## 2019-06-26 ENCOUNTER — Emergency Department (HOSPITAL_COMMUNITY): Payer: Medicaid Other | Admitting: Anesthesiology

## 2019-06-26 ENCOUNTER — Encounter (HOSPITAL_COMMUNITY): Payer: Self-pay | Admitting: Emergency Medicine

## 2019-06-26 ENCOUNTER — Encounter (HOSPITAL_COMMUNITY)
Admission: EM | Disposition: A | Discharge status: Other Acute Inpatient Hospital | Payer: Self-pay | Source: Home / Self Care | Attending: Emergency Medicine

## 2019-06-26 DIAGNOSIS — S0181XA Laceration without foreign body of other part of head, initial encounter: Secondary | ICD-10-CM | POA: Diagnosis not present

## 2019-06-26 DIAGNOSIS — Y939 Activity, unspecified: Secondary | ICD-10-CM | POA: Diagnosis not present

## 2019-06-26 DIAGNOSIS — Y999 Unspecified external cause status: Secondary | ICD-10-CM | POA: Insufficient documentation

## 2019-06-26 DIAGNOSIS — S27329A Contusion of lung, unspecified, initial encounter: Secondary | ICD-10-CM | POA: Diagnosis not present

## 2019-06-26 DIAGNOSIS — S0991XA Unspecified injury of ear, initial encounter: Secondary | ICD-10-CM | POA: Insufficient documentation

## 2019-06-26 DIAGNOSIS — S27321A Contusion of lung, unilateral, initial encounter: Secondary | ICD-10-CM | POA: Insufficient documentation

## 2019-06-26 DIAGNOSIS — R112 Nausea with vomiting, unspecified: Secondary | ICD-10-CM | POA: Diagnosis not present

## 2019-06-26 DIAGNOSIS — Y929 Unspecified place or not applicable: Secondary | ICD-10-CM | POA: Diagnosis not present

## 2019-06-26 DIAGNOSIS — S20219A Contusion of unspecified front wall of thorax, initial encounter: Secondary | ICD-10-CM | POA: Insufficient documentation

## 2019-06-26 DIAGNOSIS — R0789 Other chest pain: Secondary | ICD-10-CM | POA: Diagnosis not present

## 2019-06-26 DIAGNOSIS — Y92410 Unspecified street and highway as the place of occurrence of the external cause: Secondary | ICD-10-CM | POA: Insufficient documentation

## 2019-06-26 DIAGNOSIS — T07XXXA Unspecified multiple injuries, initial encounter: Secondary | ICD-10-CM | POA: Diagnosis present

## 2019-06-26 DIAGNOSIS — Z882 Allergy status to sulfonamides status: Secondary | ICD-10-CM | POA: Insufficient documentation

## 2019-06-26 DIAGNOSIS — S0240CA Maxillary fracture, right side, initial encounter for closed fracture: Secondary | ICD-10-CM | POA: Insufficient documentation

## 2019-06-26 DIAGNOSIS — R040 Epistaxis: Secondary | ICD-10-CM | POA: Diagnosis not present

## 2019-06-26 DIAGNOSIS — H9223 Otorrhagia, bilateral: Secondary | ICD-10-CM | POA: Diagnosis not present

## 2019-06-26 DIAGNOSIS — Z23 Encounter for immunization: Secondary | ICD-10-CM | POA: Insufficient documentation

## 2019-06-26 DIAGNOSIS — R1084 Generalized abdominal pain: Secondary | ICD-10-CM | POA: Insufficient documentation

## 2019-06-26 DIAGNOSIS — S02609A Fracture of mandible, unspecified, initial encounter for closed fracture: Secondary | ICD-10-CM | POA: Insufficient documentation

## 2019-06-26 DIAGNOSIS — S0219XA Other fracture of base of skull, initial encounter for closed fracture: Secondary | ICD-10-CM | POA: Insufficient documentation

## 2019-06-26 DIAGNOSIS — R079 Chest pain, unspecified: Secondary | ICD-10-CM | POA: Insufficient documentation

## 2019-06-26 DIAGNOSIS — S0292XB Unspecified fracture of facial bones, initial encounter for open fracture: Secondary | ICD-10-CM | POA: Diagnosis not present

## 2019-06-26 DIAGNOSIS — S0280XA Fracture of other specified skull and facial bones, unspecified side, initial encounter for closed fracture: Secondary | ICD-10-CM | POA: Insufficient documentation

## 2019-06-26 DIAGNOSIS — I499 Cardiac arrhythmia, unspecified: Secondary | ICD-10-CM | POA: Diagnosis not present

## 2019-06-26 DIAGNOSIS — S0303XA Dislocation of jaw, bilateral, initial encounter: Secondary | ICD-10-CM | POA: Diagnosis not present

## 2019-06-26 DIAGNOSIS — S01511A Laceration without foreign body of lip, initial encounter: Secondary | ICD-10-CM | POA: Insufficient documentation

## 2019-06-26 DIAGNOSIS — Z20828 Contact with and (suspected) exposure to other viral communicable diseases: Secondary | ICD-10-CM | POA: Insufficient documentation

## 2019-06-26 DIAGNOSIS — S0240DA Maxillary fracture, left side, initial encounter for closed fracture: Secondary | ICD-10-CM | POA: Insufficient documentation

## 2019-06-26 HISTORY — PX: TRACHEOSTOMY TUBE PLACEMENT: SHX814

## 2019-06-26 LAB — URINALYSIS, ROUTINE W REFLEX MICROSCOPIC
Bilirubin Urine: NEGATIVE
Glucose, UA: NEGATIVE mg/dL
Hgb urine dipstick: NEGATIVE
Ketones, ur: NEGATIVE mg/dL
Leukocytes,Ua: NEGATIVE
Nitrite: NEGATIVE
Protein, ur: NEGATIVE mg/dL
Specific Gravity, Urine: 1.031 — ABNORMAL HIGH (ref 1.005–1.030)
pH: 7 (ref 5.0–8.0)

## 2019-06-26 LAB — I-STAT CHEM 8, ED
BUN: 7 mg/dL (ref 6–20)
Calcium, Ion: 1.15 mmol/L (ref 1.15–1.40)
Chloride: 101 mmol/L (ref 98–111)
Creatinine, Ser: 0.9 mg/dL (ref 0.61–1.24)
Glucose, Bld: 166 mg/dL — ABNORMAL HIGH (ref 70–99)
HCT: 39 % (ref 39.0–52.0)
Hemoglobin: 13.3 g/dL (ref 13.0–17.0)
Potassium: 3.2 mmol/L — ABNORMAL LOW (ref 3.5–5.1)
Sodium: 140 mmol/L (ref 135–145)
TCO2: 25 mmol/L (ref 22–32)

## 2019-06-26 LAB — LIPASE, BLOOD: Lipase: 22 U/L (ref 11–51)

## 2019-06-26 LAB — COMPREHENSIVE METABOLIC PANEL
ALT: 61 U/L — ABNORMAL HIGH (ref 0–44)
AST: 91 U/L — ABNORMAL HIGH (ref 15–41)
Albumin: 4.1 g/dL (ref 3.5–5.0)
Alkaline Phosphatase: 53 U/L (ref 38–126)
Anion gap: 9 (ref 5–15)
BUN: 7 mg/dL (ref 6–20)
CO2: 25 mmol/L (ref 22–32)
Calcium: 8.6 mg/dL — ABNORMAL LOW (ref 8.9–10.3)
Chloride: 104 mmol/L (ref 98–111)
Creatinine, Ser: 0.98 mg/dL (ref 0.61–1.24)
GFR calc Af Amer: >60 mL/min (ref >60)
GFR calc non Af Amer: >60 mL/min (ref >60)
Glucose, Bld: 176 mg/dL — ABNORMAL HIGH (ref 70–99)
Potassium: 3.2 mmol/L — ABNORMAL LOW (ref 3.5–5.1)
Sodium: 138 mmol/L (ref 135–145)
Total Bilirubin: 0.7 mg/dL (ref 0.3–1.2)
Total Protein: 6.2 g/dL — ABNORMAL LOW (ref 6.5–8.1)

## 2019-06-26 LAB — RAPID URINE DRUG SCREEN, HOSP PERFORMED
Amphetamines: NOT DETECTED
Barbiturates: NOT DETECTED
Benzodiazepines: NOT DETECTED
Cocaine: NOT DETECTED
Opiates: NOT DETECTED
Tetrahydrocannabinol: NOT DETECTED

## 2019-06-26 LAB — PROTIME-INR
INR: 1.1 (ref 0.8–1.2)
Prothrombin Time: 14.1 seconds (ref 11.4–15.2)

## 2019-06-26 LAB — MAGNESIUM: Magnesium: 1.8 mg/dL (ref 1.7–2.4)

## 2019-06-26 LAB — CBC
HCT: 39.4 % (ref 39.0–52.0)
Hemoglobin: 13.9 g/dL (ref 13.0–17.0)
MCH: 30.5 pg (ref 26.0–34.0)
MCHC: 35.3 g/dL (ref 30.0–36.0)
MCV: 86.6 fL (ref 80.0–100.0)
Platelets: 317 10*3/uL (ref 150–400)
RBC: 4.55 MIL/uL (ref 4.22–5.81)
RDW: 11.8 % (ref 11.5–15.5)
WBC: 18.4 10*3/uL — ABNORMAL HIGH (ref 4.0–10.5)
nRBC: 0 % (ref 0.0–0.2)

## 2019-06-26 LAB — SAMPLE TO BLOOD BANK

## 2019-06-26 LAB — TROPONIN I (HIGH SENSITIVITY): Troponin I (High Sensitivity): 5 ng/L (ref <18)

## 2019-06-26 LAB — CDS SEROLOGY

## 2019-06-26 LAB — TSH: TSH: 1.023 u[IU]/mL (ref 0.350–4.500)

## 2019-06-26 LAB — LACTIC ACID, PLASMA: Lactic Acid, Venous: 2.3 mmol/L (ref 0.5–1.9)

## 2019-06-26 LAB — ETHANOL: Alcohol, Ethyl (B): <10 mg/dL (ref <10)

## 2019-06-26 SURGERY — CREATION, TRACHEOSTOMY
Anesthesia: General | Site: Mouth

## 2019-06-26 MED ORDER — PROPOFOL 500 MG/50ML IV EMUL
INTRAVENOUS | Status: DC | PRN
  Administered 2019-06-26: 60 ug/kg/min via INTRAVENOUS

## 2019-06-26 MED ORDER — MIDAZOLAM HCL 2 MG/2ML IJ SOLN
INTRAMUSCULAR | Status: DC | PRN
  Administered 2019-06-26: 2 mg via INTRAVENOUS

## 2019-06-26 MED ORDER — FENTANYL CITRATE (PF) 250 MCG/5ML IJ SOLN
INTRAMUSCULAR | Status: AC
  Filled 2019-06-26: qty 5

## 2019-06-26 MED ORDER — LACTATED RINGERS IV SOLN
INTRAVENOUS | Status: DC | PRN
  Administered 2019-06-26 (×2): via INTRAVENOUS

## 2019-06-26 MED ORDER — PROPOFOL 10 MG/ML IV BOLUS
INTRAVENOUS | Status: AC
  Filled 2019-06-26: qty 20

## 2019-06-26 MED ORDER — KETAMINE HCL 10 MG/ML IJ SOLN
INTRAMUSCULAR | Status: DC | PRN
  Administered 2019-06-26: 10 mg via INTRAVENOUS

## 2019-06-26 MED ORDER — LIDOCAINE HCL (CARDIAC) PF 100 MG/5ML IV SOSY
PREFILLED_SYRINGE | INTRAVENOUS | Status: DC | PRN
  Administered 2019-06-26: 40 mg via INTRATRACHEAL

## 2019-06-26 MED ORDER — LIDOCAINE-EPINEPHRINE (PF) 1 %-1:200000 IJ SOLN
INTRAMUSCULAR | Status: AC
  Filled 2019-06-26: qty 30

## 2019-06-26 MED ORDER — LIDOCAINE-EPINEPHRINE 1 %-1:100000 IJ SOLN
INTRAMUSCULAR | Status: DC | PRN
  Administered 2019-06-26: 10 mL

## 2019-06-26 MED ORDER — TETANUS-DIPHTH-ACELL PERTUSSIS 5-2.5-18.5 LF-MCG/0.5 IM SUSP
0.5000 mL | Freq: Once | INTRAMUSCULAR | Status: AC
  Administered 2019-06-26: 0.5 mL via INTRAMUSCULAR
  Filled 2019-06-26: qty 0.5

## 2019-06-26 MED ORDER — PROPOFOL 10 MG/ML IV BOLUS
INTRAVENOUS | Status: DC | PRN
  Administered 2019-06-26: 150 mg via INTRAVENOUS

## 2019-06-26 MED ORDER — SUCCINYLCHOLINE 20MG/ML (10ML) SYRINGE FOR MEDFUSION PUMP - OPTIME
INTRAMUSCULAR | Status: DC | PRN
  Administered 2019-06-26: 120 mg via INTRAVENOUS

## 2019-06-26 MED ORDER — MIDAZOLAM HCL 2 MG/2ML IJ SOLN
INTRAMUSCULAR | Status: AC
  Filled 2019-06-26: qty 2

## 2019-06-26 MED ORDER — ROCURONIUM 10MG/ML (10ML) SYRINGE FOR MEDFUSION PUMP - OPTIME
INTRAVENOUS | Status: DC | PRN
  Administered 2019-06-26 (×2): 50 mg via INTRAVENOUS

## 2019-06-26 MED ORDER — IOHEXOL 300 MG/ML  SOLN
100.0000 mL | Freq: Once | INTRAMUSCULAR | Status: AC | PRN
  Administered 2019-06-26: 100 mL via INTRAVENOUS

## 2019-06-26 MED ORDER — KETAMINE HCL 50 MG/5ML IJ SOSY
PREFILLED_SYRINGE | INTRAMUSCULAR | Status: AC
  Filled 2019-06-26: qty 5

## 2019-06-26 SURGICAL SUPPLY — 33 items
BLADE CLIPPER SURG (BLADE) ×2 IMPLANT
BLADE SURG 15 STRL LF DISP TIS (BLADE) ×1 IMPLANT
BLADE SURG 15 STRL SS (BLADE) ×1
CANISTER SUCT 3000ML PPV (MISCELLANEOUS) ×2 IMPLANT
CLEANER TIP ELECTROSURG 2X2 (MISCELLANEOUS) ×2 IMPLANT
COVER SURGICAL LIGHT HANDLE (MISCELLANEOUS) ×2 IMPLANT
COVER WAND RF STERILE (DRAPES) ×2 IMPLANT
DECANTER SPIKE VIAL GLASS SM (MISCELLANEOUS) ×2 IMPLANT
DRAPE HALF SHEET 40X57 (DRAPES) ×2 IMPLANT
ELECT COATED BLADE 2.86 ST (ELECTRODE) ×2 IMPLANT
ELECT REM PT RETURN 9FT ADLT (ELECTROSURGICAL) ×2
ELECTRODE REM PT RTRN 9FT ADLT (ELECTROSURGICAL) ×1 IMPLANT
GLOVE BIO SURGEON STRL SZ7 (GLOVE) ×2 IMPLANT
GLOVE BIOGEL PI IND STRL 7.0 (GLOVE) ×1 IMPLANT
GLOVE BIOGEL PI INDICATOR 7.0 (GLOVE) ×1
GLOVE ECLIPSE 8.0 STRL XLNG CF (GLOVE) ×4 IMPLANT
GOWN STRL REUS W/ TWL LRG LVL3 (GOWN DISPOSABLE) ×1 IMPLANT
GOWN STRL REUS W/ TWL XL LVL3 (GOWN DISPOSABLE) ×1 IMPLANT
GOWN STRL REUS W/TWL LRG LVL3 (GOWN DISPOSABLE) ×1
GOWN STRL REUS W/TWL XL LVL3 (GOWN DISPOSABLE) ×1
KIT BASIN OR (CUSTOM PROCEDURE TRAY) ×2 IMPLANT
KIT TURNOVER KIT B (KITS) ×2 IMPLANT
NEEDLE HYPO 25GX1X1/2 BEV (NEEDLE) ×2 IMPLANT
NS IRRIG 1000ML POUR BTL (IV SOLUTION) ×2 IMPLANT
PAD ARMBOARD 7.5X6 YLW CONV (MISCELLANEOUS) ×4 IMPLANT
PENCIL BUTTON HOLSTER BLD 10FT (ELECTRODE) ×2 IMPLANT
POSITIONER HEAD DONUT 9IN (MISCELLANEOUS) ×2 IMPLANT
SPONGE DRAIN TRACH 4X4 STRL 2S (GAUZE/BANDAGES/DRESSINGS) ×2 IMPLANT
SUT CHROMIC 2 0 SH (SUTURE) ×2 IMPLANT
SUT ETHILON 2 0 FS 18 (SUTURE) IMPLANT
SUT SILK 2 0 SH CR/8 (SUTURE) ×2 IMPLANT
TRAY ENT MC OR (CUSTOM PROCEDURE TRAY) ×2 IMPLANT
WATER STERILE IRR 1000ML POUR (IV SOLUTION) ×2 IMPLANT
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
## 2019-06-26 NOTE — Transfer of Care (Signed)
Immediate Anesthesia Transfer of Care Note  Patient: Jason Peters  Procedure(s) Performed: Intabation directed by anesthesia (N/A Mouth)  Patient Location: Emergency Room B. Reported to transport team from [facility]  Anesthesia Type:General  Level of Consciousness: Patient remains intubated per anesthesia plan  Airway & Oxygen Therapy: Patient placed on Ventilator (see vital sign flow sheet for setting)  Post-op Assessment: Report given to RN and Post -op Vital signs reviewed and stable  Post vital signs: Reviewed and stable  Last Vitals:  Vitals Value Taken Time  BP 130/68 06/26/19 2318  Temp 37.1 C 06/26/19 2318  Pulse 88 06/26/19 2318  Resp 18 06/26/19 2318  SpO2 100 % 06/26/19 2318    Last Pain:  Vitals:   06/26/19 2328  TempSrc:   PainSc: 0-No pain         Complications: No apparent anesthesia complications
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
## 2019-06-26 NOTE — Progress Notes (Signed)
Pt's wife at bedside, Nira Conn, phone # [phone]

## 2019-06-26 NOTE — ED Notes (Signed)
Girlfriend aware pt is being transferred to [facility]. Pt unable to sign due to intubation

## 2019-06-26 NOTE — ED Notes (Signed)
Pt aware of need for urine sample, unable to urinate at this time.
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
## 2019-06-26 NOTE — Anesthesia Procedure Notes (Signed)
Procedure Name: Intubation Date/Time: 06/26/2019 10:42 PM Performed by: Valetta Fuller, CRNA Pre-anesthesia Checklist: Patient identified, Emergency Drugs available, Suction available, Patient being monitored and Timeout performed Patient Re-evaluated:Patient Re-evaluated prior to induction Oxygen Delivery Method: Circle system utilized Preoxygenation: Pre-oxygenation with 100% oxygen Induction Type: IV induction and Rapid sequence Laryngoscope Size: Glidescope Grade View: Grade I Tube type: Oral Tube size: 8.0 mm Number of attempts: 1 Airway Equipment and Method: Stylet Placement Confirmation: ETT inserted through vocal cords under direct vision,  positive ETCO2 and breath sounds checked- equal and bilateral Secured at: 26 cm Tube secured with: secured with device from RT. Dental Injury: Teeth and Oropharynx as per pre-operative assessment  Comments: Front of cervical collar removed to prep for possible emergency tracheostomy and for intubation. Head remained neutral throughout induction and intubation. Cervical collar replaced.

## 2019-06-26 NOTE — ED Notes (Signed)
Spoke with lab about adding on labs.

## 2019-06-26 NOTE — Anesthesia Preprocedure Evaluation (Signed)
Anesthesia Evaluation  Patient identified by MRN, date of birth, ID band Patient awake    Reviewed: Allergy & Precautions, NPO status , Patient's Chart, lab work & pertinent test results  Airway Mallampati: IV     Mouth opening: Limited Mouth Opening  Dental   Pulmonary  Traumatic facial fractures   breath sounds clear to auscultation       Cardiovascular negative cardio ROS   Rhythm:Regular Rate:Tachycardia     Neuro/Psych    GI/Hepatic negative GI ROS, Neg liver ROS,   Endo/Other  negative endocrine ROS  Renal/GU negative Renal ROS     Musculoskeletal   Abdominal   Peds  Hematology   Anesthesia Other Findings   Reproductive/Obstetrics                             Anesthesia Physical Anesthesia Plan  ASA: IV and emergent  Anesthesia Plan: General   Post-op Pain Management:    Induction: Intravenous and Rapid sequence  PONV Risk Score and Plan: 2 and Treatment may vary due to age or medical condition, Propofol infusion and Midazolam  Airway Management Planned: Oral ETT and Video Laryngoscope Planned  Additional Equipment:   Intra-op Plan:   Post-operative Plan: Post-operative intubation/ventilation  Informed Consent: I have reviewed the patients History and Physical, chart, labs and discussed the procedure including the risks, benefits and alternatives for the proposed anesthesia with the patient or authorized representative who has indicated his/her understanding and acceptance.       Plan Discussed with: CRNA and Surgeon  Anesthesia Plan Comments: (RSI with ENT at bedside for possible trach 2/2 extensive facial fractures. Pt needing secured airway prior to transport to [facility] for further management.)        Anesthesia Quick Evaluation

## 2019-06-26 NOTE — Progress Notes (Signed)
Pt transported on Vent from OR to ED trama B without complications. [facility] transport at bedside
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
## 2019-06-26 NOTE — ED Notes (Signed)
Hand off given to [employer].

## 2019-06-27 LAB — SARS CORONAVIRUS 2 BY RT PCR (HOSPITAL ORDER, PERFORMED IN ~~LOC~~ HOSPITAL LAB): SARS Coronavirus 2: NEGATIVE

## 2019-06-27 NOTE — Anesthesia Postprocedure Evaluation (Signed)
Anesthesia Post Note  Patient: Jason Peters  Procedure(s) Performed: Intabation directed by anesthesia (N/A Mouth)     Patient location during evaluation: Other (ED for transport to [facility]) Anesthesia Type: General Level of consciousness: sedated Pain management: pain level controlled Vital Signs Assessment: post-procedure vital signs reviewed and stable Respiratory status: patient remains intubated per anesthesia plan Cardiovascular status: stable Postop Assessment: no apparent nausea or vomiting Anesthetic complications: no    Last Vitals:  Vitals:   06/26/19 2200 06/26/19 2318  BP: (!) 151/98 130/68  Pulse: (!) 113 88  Resp: 20 18  Temp:  37.1 C  SpO2: 100% 100%    Last Pain:  Vitals:   06/26/19 2328  TempSrc:   PainSc: 0-No pain                 Tiajuana Amass

## 2019-06-28 ENCOUNTER — Encounter (HOSPITAL_COMMUNITY): Payer: Self-pay | Admitting: Otolaryngology

## 2019-06-28 ENCOUNTER — Encounter (HOSPITAL_COMMUNITY): Payer: Self-pay | Admitting: Emergency Medicine

## 2019-07-02 MED ORDER — ACETAMINOPHEN 325 MG PO TABS
325.00 | ORAL_TABLET | ORAL | Status: DC
Start: 2019-07-02 — End: 2019-07-02

## 2019-07-02 MED ORDER — GENERIC EXTERNAL MEDICATION
Status: DC
Start: ? — End: 2019-07-02

## 2019-07-02 MED ORDER — VALPROIC ACID 250 MG/5ML PO SOLN
500.00 | ORAL | Status: DC
Start: 2019-07-02 — End: 2019-07-02

## 2019-07-02 MED ORDER — DOCUSATE SODIUM 283 MG RE ENEM
283.00 | ENEMA | RECTAL | Status: DC
Start: ? — End: 2019-07-02

## 2019-07-02 MED ORDER — QUINTABS PO TABS
1.00 | ORAL_TABLET | ORAL | Status: DC
Start: 2019-07-03 — End: 2019-07-02

## 2019-07-02 MED ORDER — GENERIC EXTERNAL MEDICATION
5.00 | Status: DC
Start: ? — End: 2019-07-02

## 2019-07-02 MED ORDER — GENERIC EXTERNAL MEDICATION
100.00 | Status: DC
Start: 2019-07-03 — End: 2019-07-02

## 2019-07-02 MED ORDER — GENERIC EXTERNAL MEDICATION
0.00 | Status: DC
Start: ? — End: 2019-07-02

## 2019-07-02 MED ORDER — POLYETHYLENE GLYCOL 3350 17 G PO PACK
17.00 | PACK | ORAL | Status: DC
Start: 2019-07-03 — End: 2019-07-02

## 2019-07-02 MED ORDER — VALPROIC ACID 250 MG/5ML PO SOLN
250.00 | ORAL | Status: DC
Start: 2019-07-02 — End: 2019-07-02

## 2019-07-02 MED ORDER — GENERIC EXTERNAL MEDICATION
3.00 | Status: DC
Start: 2019-07-02 — End: 2019-07-02

## 2019-07-02 MED ORDER — SENNOSIDES-DOCUSATE SODIUM 8.6-50 MG PO TABS
2.00 | ORAL_TABLET | ORAL | Status: DC
Start: 2019-07-02 — End: 2019-07-02

## 2019-07-02 MED ORDER — OXYMETAZOLINE HCL 0.05 % NA SOLN
2.00 | NASAL | Status: DC
Start: 2019-07-02 — End: 2019-07-02

## 2019-07-02 MED ORDER — OXYCODONE-ACETAMINOPHEN 5-325 MG PO TABS
1.00 | ORAL_TABLET | ORAL | Status: DC
Start: ? — End: 2019-07-02

## 2019-07-02 MED ORDER — MELATONIN 3 MG PO TABS
6.00 | ORAL_TABLET | ORAL | Status: DC
Start: 2019-07-02 — End: 2019-07-02

## 2019-07-02 MED ORDER — SODIUM CHLORIDE 0.9 % IV SOLN
INTRAVENOUS | Status: DC
Start: ? — End: 2019-07-02

## 2019-07-02 MED ORDER — ENOXAPARIN SODIUM 30 MG/0.3ML ~~LOC~~ SOLN
30.00 | SUBCUTANEOUS | Status: DC
Start: 2019-07-02 — End: 2019-07-02

## 2019-07-02 MED ORDER — FA-PYRIDOXINE-CYANOCOBALAMIN 2.5-25-2 MG PO TABS
1.00 | ORAL_TABLET | ORAL | Status: DC
Start: 2019-07-03 — End: 2019-07-02

## 2019-07-02 MED ORDER — GENERIC EXTERNAL MEDICATION
Status: DC
Start: 2019-07-02 — End: 2019-07-02

## 2019-07-02 MED ORDER — BACITRACIN ZINC 500 UNIT/GM EX OINT
TOPICAL_OINTMENT | CUTANEOUS | Status: DC
Start: 2019-07-02 — End: 2019-07-02

## 2019-07-02 MED ORDER — RISPERIDONE 1 MG PO TABS
2.00 | ORAL_TABLET | ORAL | Status: DC
Start: 2019-07-02 — End: 2019-07-02

## 2019-07-02 MED ORDER — TRAMADOL HCL 50 MG PO TABS
100.00 | ORAL_TABLET | ORAL | Status: DC
Start: ? — End: 2019-07-02

## 2019-07-02 MED ORDER — GENERIC EXTERNAL MEDICATION
40.00 | Status: DC
Start: 2019-07-03 — End: 2019-07-02

## 2019-07-02 MED ORDER — CYCLOBENZAPRINE HCL 10 MG PO TABS
10.00 | ORAL_TABLET | ORAL | Status: DC
Start: ? — End: 2019-07-02

## 2019-07-19 ENCOUNTER — Other Ambulatory Visit: Payer: Self-pay

## 2019-07-19 ENCOUNTER — Encounter (HOSPITAL_COMMUNITY): Payer: Self-pay | Admitting: Emergency Medicine

## 2019-07-19 ENCOUNTER — Emergency Department (HOSPITAL_COMMUNITY)
Admission: EM | Admit: 2019-07-19 | Discharge: 2019-07-20 | Disposition: A | Discharge status: Home / Self Care | Payer: BC Managed Care – PPO | Attending: Emergency Medicine | Admitting: Emergency Medicine

## 2019-07-19 DIAGNOSIS — Z5321 Procedure and treatment not carried out due to patient leaving prior to being seen by health care provider: Secondary | ICD-10-CM | POA: Insufficient documentation

## 2019-07-19 LAB — BASIC METABOLIC PANEL
Anion gap: 10 (ref 5–15)
BUN: 10 mg/dL (ref 6–20)
CO2: 26 mmol/L (ref 22–32)
Calcium: 9.2 mg/dL (ref 8.9–10.3)
Chloride: 101 mmol/L (ref 98–111)
Creatinine, Ser: 0.8 mg/dL (ref 0.61–1.24)
GFR calc Af Amer: >60 mL/min (ref >60)
GFR calc non Af Amer: >60 mL/min (ref >60)
Glucose, Bld: 87 mg/dL (ref 70–99)
Potassium: 4 mmol/L (ref 3.5–5.1)
Sodium: 137 mmol/L (ref 135–145)

## 2019-07-19 LAB — CBC
HCT: 34.8 % — ABNORMAL LOW (ref 39.0–52.0)
Hemoglobin: 11.5 g/dL — ABNORMAL LOW (ref 13.0–17.0)
MCH: 29.1 pg (ref 26.0–34.0)
MCHC: 33 g/dL (ref 30.0–36.0)
MCV: 88.1 fL (ref 80.0–100.0)
Platelets: 364 10*3/uL (ref 150–400)
RBC: 3.95 MIL/uL — ABNORMAL LOW (ref 4.22–5.81)
RDW: 12.6 % (ref 11.5–15.5)
WBC: 7 10*3/uL (ref 4.0–10.5)
nRBC: 0 % (ref 0.0–0.2)
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
## 2019-07-20 NOTE — ED Notes (Signed)
Patient called twice for vitals recheck with no response and not visible in lobby 

## 2019-07-22 ENCOUNTER — Emergency Department (HOSPITAL_COMMUNITY)
Admission: EM | Admit: 2019-07-22 | Discharge: 2019-07-22 | Disposition: A | Discharge status: Home / Self Care | Payer: Self-pay | Attending: Emergency Medicine | Admitting: Emergency Medicine

## 2019-07-22 ENCOUNTER — Other Ambulatory Visit: Payer: Self-pay

## 2019-07-22 ENCOUNTER — Encounter (HOSPITAL_COMMUNITY): Payer: Self-pay

## 2019-07-22 DIAGNOSIS — Z5321 Procedure and treatment not carried out due to patient leaving prior to being seen by health care provider: Secondary | ICD-10-CM | POA: Insufficient documentation

## 2019-07-22 DIAGNOSIS — Z4801 Encounter for change or removal of surgical wound dressing: Secondary | ICD-10-CM | POA: Insufficient documentation

## 2019-07-22 NOTE — ED Notes (Signed)
Called pt to recheck vitals. No response and this tech does not see patient in the waiting room.

## 2019-07-22 NOTE — ED Notes (Signed)
Last call for pt. No response.  

## 2019-07-22 NOTE — ED Notes (Signed)
Pt was seen here 2 days ago and LWBS due to wait time, blood work drawn then, no labs ordered today.

## 2019-07-22 NOTE — ED Notes (Signed)
2x calling pt. No response.
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
## 2019-07-24 ENCOUNTER — Emergency Department (HOSPITAL_COMMUNITY): Payer: Self-pay

## 2019-07-24 ENCOUNTER — Encounter (HOSPITAL_COMMUNITY): Payer: Self-pay

## 2019-07-24 ENCOUNTER — Emergency Department (HOSPITAL_COMMUNITY)
Admission: EM | Admit: 2019-07-24 | Discharge: 2019-07-24 | Disposition: A | Discharge status: Other Acute Inpatient Hospital | Payer: Self-pay | Attending: Emergency Medicine | Admitting: Emergency Medicine

## 2019-07-24 ENCOUNTER — Other Ambulatory Visit: Payer: Self-pay

## 2019-07-24 DIAGNOSIS — R519 Headache, unspecified: Secondary | ICD-10-CM | POA: Insufficient documentation

## 2019-07-24 DIAGNOSIS — Y818 Miscellaneous general- and plastic-surgery devices associated with adverse incidents, not elsewhere classified: Secondary | ICD-10-CM | POA: Insufficient documentation

## 2019-07-24 DIAGNOSIS — T8140XA Infection following a procedure, unspecified, initial encounter: Secondary | ICD-10-CM | POA: Insufficient documentation

## 2019-07-24 DIAGNOSIS — Z87891 Personal history of nicotine dependence: Secondary | ICD-10-CM | POA: Insufficient documentation

## 2019-07-24 DIAGNOSIS — Z79899 Other long term (current) drug therapy: Secondary | ICD-10-CM | POA: Insufficient documentation

## 2019-07-24 DIAGNOSIS — M272 Inflammatory conditions of jaws: Secondary | ICD-10-CM

## 2019-07-24 DIAGNOSIS — Z930 Tracheostomy status: Secondary | ICD-10-CM | POA: Insufficient documentation

## 2019-07-24 DIAGNOSIS — Z931 Gastrostomy status: Secondary | ICD-10-CM | POA: Insufficient documentation

## 2019-07-24 LAB — CBC WITH DIFFERENTIAL/PLATELET
Abs Immature Granulocytes: 0.01 10*3/uL (ref 0.00–0.07)
Basophils Absolute: 0 10*3/uL (ref 0.0–0.1)
Basophils Relative: 0 %
Eosinophils Absolute: 0.1 10*3/uL (ref 0.0–0.5)
Eosinophils Relative: 2 %
HCT: 36 % — ABNORMAL LOW (ref 39.0–52.0)
Hemoglobin: 11.7 g/dL — ABNORMAL LOW (ref 13.0–17.0)
Immature Granulocytes: 0 %
Lymphocytes Relative: 25 %
Lymphs Abs: 1.5 10*3/uL (ref 0.7–4.0)
MCH: 28.7 pg (ref 26.0–34.0)
MCHC: 32.5 g/dL (ref 30.0–36.0)
MCV: 88.5 fL (ref 80.0–100.0)
Monocytes Absolute: 0.4 10*3/uL (ref 0.1–1.0)
Monocytes Relative: 6 %
Neutro Abs: 4.1 10*3/uL (ref 1.7–7.7)
Neutrophils Relative %: 67 %
Platelets: 224 10*3/uL (ref 150–400)
RBC: 4.07 MIL/uL — ABNORMAL LOW (ref 4.22–5.81)
RDW: 12.5 % (ref 11.5–15.5)
WBC: 6.1 10*3/uL (ref 4.0–10.5)
nRBC: 0 % (ref 0.0–0.2)

## 2019-07-24 LAB — BASIC METABOLIC PANEL
Anion gap: 8 (ref 5–15)
BUN: 12 mg/dL (ref 6–20)
CO2: 26 mmol/L (ref 22–32)
Calcium: 9.1 mg/dL (ref 8.9–10.3)
Chloride: 103 mmol/L (ref 98–111)
Creatinine, Ser: 0.8 mg/dL (ref 0.61–1.24)
GFR calc Af Amer: >60 mL/min (ref >60)
GFR calc non Af Amer: >60 mL/min (ref >60)
Glucose, Bld: 90 mg/dL (ref 70–99)
Potassium: 3.7 mmol/L (ref 3.5–5.1)
Sodium: 137 mmol/L (ref 135–145)

## 2019-07-24 LAB — C-REACTIVE PROTEIN: CRP: <0.8 mg/dL (ref <1.0)

## 2019-07-24 LAB — SEDIMENTATION RATE: Sed Rate: 15 mm/hr (ref 0–16)

## 2019-07-24 MED ORDER — SODIUM CHLORIDE 0.9 % IV BOLUS
1000.0000 mL | Freq: Once | INTRAVENOUS | Status: AC
  Administered 2019-07-24: 1000 mL via INTRAVENOUS

## 2019-07-24 MED ORDER — METOCLOPRAMIDE HCL 5 MG/ML IJ SOLN
10.0000 mg | Freq: Once | INTRAMUSCULAR | Status: AC
  Administered 2019-07-24: 10 mg via INTRAVENOUS
  Filled 2019-07-24: qty 2

## 2019-07-24 MED ORDER — HYDROMORPHONE HCL 1 MG/ML IJ SOLN
1.0000 mg | Freq: Once | INTRAMUSCULAR | Status: AC
  Administered 2019-07-24: 1 mg via INTRAVENOUS

## 2019-07-24 MED ORDER — SODIUM CHLORIDE 0.9 % IV SOLN
2.0000 g | Freq: Once | INTRAVENOUS | Status: AC
  Administered 2019-07-24: 2 g via INTRAVENOUS
  Filled 2019-07-24: qty 20

## 2019-07-24 MED ORDER — HYDROMORPHONE HCL 1 MG/ML IJ SOLN: 1.0000 mg | Freq: Once | INTRAMUSCULAR | Status: DC

## 2019-07-24 MED ORDER — VANCOMYCIN HCL IN DEXTROSE 1-5 GM/200ML-% IV SOLN
1000.0000 mg | INTRAVENOUS | Status: AC
  Administered 2019-07-24: 1000 mg via INTRAVENOUS
  Filled 2019-07-24: qty 200

## 2019-07-24 MED ORDER — VANCOMYCIN HCL IN DEXTROSE 1-5 GM/200ML-% IV SOLN: 1000.0000 mg | Freq: Three times a day (TID) | INTRAVENOUS | Status: DC

## 2019-07-24 MED ORDER — HYDROMORPHONE HCL 1 MG/ML IJ SOLN
1.0000 mg | Freq: Once | INTRAMUSCULAR | Status: AC
  Administered 2019-07-24: 1 mg via INTRAVENOUS
  Filled 2019-07-24: qty 1

## 2019-07-24 MED ORDER — HYDROMORPHONE HCL 1 MG/ML IJ SOLN
INTRAMUSCULAR | Status: AC
  Filled 2019-07-24: qty 1

## 2019-07-24 MED ORDER — IOHEXOL 300 MG/ML  SOLN
100.0000 mL | Freq: Once | INTRAMUSCULAR | Status: AC | PRN
  Administered 2019-07-24: 100 mL via INTRAVENOUS

## 2019-07-24 MED ORDER — DIPHENHYDRAMINE HCL 50 MG/ML IJ SOLN
25.0000 mg | Freq: Once | INTRAMUSCULAR | Status: AC
  Administered 2019-07-24: 25 mg via INTRAVENOUS
  Filled 2019-07-24: qty 1
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
## 2019-07-24 NOTE — ED Notes (Signed)
[facility] Jason Peters per supervisor patient want  be able to be trans to [facility] tell day shift tomorrow

## 2019-07-24 NOTE — ED Notes (Signed)
[facility]link Tammy said per supervisor

## 2019-07-24 NOTE — Progress Notes (Signed)
  Pharmacy Antibiotic Note  Jason Peters is a 28 y.o. male admitted on 07/24/2019 with osteomyelitis .  Pharmacy has been consulted for vancomycin dosing.  Plan: vancomycin 2gm iv x 1  Vancomycin 1000mg  IV every 8 hours.  Goal trough 15-20 mcg/mL.  Height: 6\' 2"  (188 cm) Weight: 189 lb 9.5 oz (86 kg) IBW/kg (Calculated) : 82.2  Temp (24hrs), Avg:97.3 F (36.3 C), Min:97.3 F (36.3 C), Max:97.3 F (36.3 C)  Recent Labs  Lab 07/19/19 2207 07/24/19 1645  WBC 7.0 6.1  CREATININE 0.80 0.80    Estimated Creatinine Clearance: 162.7 mL/min (by C-G formula based on SCr of 0.8 mg/dL).    Allergies  Allergen Reactions  . Sulfa Antibiotics     Rash  . Sulfa Antibiotics Rash    Antimicrobials this admission: 10/31 vancomycin >>  Microbiology results: none  Thank you for allowing pharmacy to be a part of this patient's care.  Jason Peters 07/24/2019 6:58 PM

## 2019-07-24 NOTE — ED Notes (Signed)
Called [place] [facility] to take pt to [facility] they can not right now but will try later
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
## 2019-07-28 ENCOUNTER — Other Ambulatory Visit: Payer: Self-pay

## 2019-07-28 ENCOUNTER — Emergency Department (HOSPITAL_COMMUNITY): Payer: Self-pay

## 2019-07-28 ENCOUNTER — Emergency Department (HOSPITAL_COMMUNITY)
Admission: EM | Admit: 2019-07-28 | Discharge: 2019-07-28 | Disposition: A | Discharge status: Home / Self Care | Payer: Self-pay | Attending: Emergency Medicine | Admitting: Emergency Medicine

## 2019-07-28 DIAGNOSIS — R0602 Shortness of breath: Secondary | ICD-10-CM

## 2019-07-28 DIAGNOSIS — Z87891 Personal history of nicotine dependence: Secondary | ICD-10-CM | POA: Insufficient documentation

## 2019-07-28 DIAGNOSIS — Z79899 Other long term (current) drug therapy: Secondary | ICD-10-CM | POA: Insufficient documentation

## 2019-07-28 NOTE — ED Notes (Signed)
Patient transported to X-ray 

## 2019-07-28 NOTE — ED Triage Notes (Signed)
Pt reports SOB for the couple days. Denies recent fever/cough just states its hard to breathe. Pt has trach, spo2 100%, NAD at present. VSS.

## 2019-07-28 NOTE — Discharge Instructions (Signed)
You have been evaluated for your shortness of breath.  Your trach appears to be in good functioning condition.  Your vital sign is normal. Your chest xray is normal.  Please call and follow up with your trauma surgery team for further management of your trach and your respiratory discomfort. Continue to clean trach nightly.
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
## 2019-08-05 ENCOUNTER — Encounter (HOSPITAL_COMMUNITY): Payer: Self-pay | Admitting: Emergency Medicine

## 2019-08-05 ENCOUNTER — Emergency Department (HOSPITAL_COMMUNITY)
Admission: EM | Admit: 2019-08-05 | Discharge: 2019-08-05 | Disposition: A | Discharge status: Home / Self Care | Payer: Self-pay | Attending: Emergency Medicine | Admitting: Emergency Medicine

## 2019-08-05 ENCOUNTER — Other Ambulatory Visit: Payer: Self-pay

## 2019-08-05 DIAGNOSIS — J9500 Unspecified tracheostomy complication: Secondary | ICD-10-CM

## 2019-08-05 DIAGNOSIS — Z79899 Other long term (current) drug therapy: Secondary | ICD-10-CM | POA: Insufficient documentation

## 2019-08-05 DIAGNOSIS — J9509 Other tracheostomy complication: Secondary | ICD-10-CM | POA: Insufficient documentation

## 2019-08-05 DIAGNOSIS — Z87891 Personal history of nicotine dependence: Secondary | ICD-10-CM | POA: Insufficient documentation

## 2019-08-05 MED ORDER — ACETAMINOPHEN 325 MG PO TABS
650.0000 mg | ORAL_TABLET | Freq: Once | ORAL | Status: AC
  Administered 2019-08-05: 650 mg via ORAL
  Filled 2019-08-05: qty 2
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
## 2019-08-05 NOTE — Discharge Instructions (Addendum)
Contact a health care provider if you have: A fever or chills. A cough that does not go away and produces more mucus. Increased crusting, bleeding, or irritation around your tracheostomy opening. Mucus that smells bad or is discolored. Questions about how to care for your tracheostomy. Get help right away if: You have trouble breathing. Your tracheal tube comes out and you cannot replace it. You have persistent bleeding or unusual drainage from your tracheostomy opening. You start to choke after eating.

## 2019-08-05 NOTE — ED Triage Notes (Addendum)
Patient has a new endotracheal tube since October 3rd and has an appt. on the 17th to see about going down in size. Patient says his airway is obstructed with the tube in place and experiences shortness of breath. Was seen at [facility] for the same problem a few days ago with no resolution.

## 2019-08-05 NOTE — Progress Notes (Signed)
Unable to place trach back in patient. Patient's stoma has closed up at the trachea and there is only a small hole big enough to get a q-tip in at the soft tissue. Cleaned site and told patient to follow up with his trauma MD. Patient in no respiratory distress at this time and states that he feels better without trach in place.

## 2019-09-11 ENCOUNTER — Other Ambulatory Visit: Payer: Self-pay

## 2019-09-11 ENCOUNTER — Emergency Department (HOSPITAL_COMMUNITY): Payer: Medicaid Other

## 2019-09-11 ENCOUNTER — Emergency Department (HOSPITAL_COMMUNITY)
Admission: EM | Admit: 2019-09-11 | Discharge: 2019-09-11 | Disposition: A | Discharge status: Home / Self Care | Payer: Medicaid Other | Attending: Emergency Medicine | Admitting: Emergency Medicine

## 2019-09-11 ENCOUNTER — Encounter (HOSPITAL_COMMUNITY): Payer: Self-pay | Admitting: Emergency Medicine

## 2019-09-11 DIAGNOSIS — H9223 Otorrhagia, bilateral: Secondary | ICD-10-CM | POA: Insufficient documentation

## 2019-09-11 DIAGNOSIS — H9221 Otorrhagia, right ear: Secondary | ICD-10-CM

## 2019-09-11 DIAGNOSIS — R1310 Dysphagia, unspecified: Secondary | ICD-10-CM | POA: Diagnosis not present

## 2019-09-11 DIAGNOSIS — Z87891 Personal history of nicotine dependence: Secondary | ICD-10-CM | POA: Diagnosis not present

## 2019-09-11 DIAGNOSIS — H9222 Otorrhagia, left ear: Secondary | ICD-10-CM

## 2019-09-11 LAB — CBC WITH DIFFERENTIAL/PLATELET
Abs Immature Granulocytes: 0.02 10*3/uL (ref 0.00–0.07)
Basophils Absolute: 0 10*3/uL (ref 0.0–0.1)
Basophils Relative: 0 %
Eosinophils Absolute: 0.1 10*3/uL (ref 0.0–0.5)
Eosinophils Relative: 1 %
HCT: 43.2 % (ref 39.0–52.0)
Hemoglobin: 14.3 g/dL (ref 13.0–17.0)
Immature Granulocytes: 0 %
Lymphocytes Relative: 26 %
Lymphs Abs: 1.8 10*3/uL (ref 0.7–4.0)
MCH: 28.6 pg (ref 26.0–34.0)
MCHC: 33.1 g/dL (ref 30.0–36.0)
MCV: 86.4 fL (ref 80.0–100.0)
Monocytes Absolute: 0.4 10*3/uL (ref 0.1–1.0)
Monocytes Relative: 6 %
Neutro Abs: 4.6 10*3/uL (ref 1.7–7.7)
Neutrophils Relative %: 67 %
Platelets: 260 10*3/uL (ref 150–400)
RBC: 5 MIL/uL (ref 4.22–5.81)
RDW: 12.2 % (ref 11.5–15.5)
WBC: 7 10*3/uL (ref 4.0–10.5)
nRBC: 0 % (ref 0.0–0.2)

## 2019-09-11 LAB — BASIC METABOLIC PANEL
Anion gap: 7 (ref 5–15)
BUN: 10 mg/dL (ref 6–20)
CO2: 29 mmol/L (ref 22–32)
Calcium: 9.6 mg/dL (ref 8.9–10.3)
Chloride: 104 mmol/L (ref 98–111)
Creatinine, Ser: 0.7 mg/dL (ref 0.61–1.24)
GFR calc Af Amer: >60 mL/min (ref >60)
GFR calc non Af Amer: >60 mL/min (ref >60)
Glucose, Bld: 99 mg/dL (ref 70–99)
Potassium: 3.8 mmol/L (ref 3.5–5.1)
Sodium: 140 mmol/L (ref 135–145)

## 2019-09-11 MED ORDER — ACETAMINOPHEN 500 MG PO TABS: 1000.0000 mg | ORAL_TABLET | Freq: Once | ORAL | Status: DC

## 2019-09-11 MED ORDER — IOHEXOL 300 MG/ML  SOLN
75.0000 mL | Freq: Once | INTRAMUSCULAR | Status: AC | PRN
  Administered 2019-09-11: 75 mL via INTRAVENOUS

## 2019-09-11 MED ORDER — FENTANYL CITRATE (PF) 100 MCG/2ML IJ SOLN
50.0000 ug | Freq: Once | INTRAMUSCULAR | Status: AC
  Administered 2019-09-11: 50 ug via INTRAVENOUS
  Filled 2019-09-11: qty 2

## 2019-09-11 NOTE — ED Notes (Signed)
Pt reports that ENT told him that he would bleed from the ears until his jaw surgery   Was scheduled for surgery 12/31- was called by [facility] and they have rescheduled his surgery for 1/4
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
## 2019-09-11 NOTE — ED Notes (Signed)
Prior to pain meds pt reports pain at 6/10   He reports that his doctor cannot give him prx for pain meds   He has no physician and reports he is trying to get medicaid that he might see a physician for his pain issues   He reports that he uses tylenol, BC powders, aleve and anything he  'can get my hands on"
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
## 2019-09-11 NOTE — ED Notes (Signed)
Dr Lacinda Axon has eval'd

## 2019-09-11 NOTE — ED Notes (Signed)
To CT

## 2019-09-11 NOTE — Discharge Instructions (Addendum)
Tests showed no life-threatening condition.  If symptoms worsen, recommend driving to [facility] for further evaluation.

## 2019-09-11 NOTE — ED Notes (Signed)
Pt noted to have rolled up paper towels and stuffed into his ears   He is educated regarding introduction of this and potential introduction of infection when putting paper towels in ears
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
